# Patient Record
Sex: Female | Born: 1980 | Race: White | Hispanic: No | Marital: Married | Smoking: Former smoker
Health system: Southern US, Community
[De-identification: ages and names within clinical notes are randomized; demographics above are authoritative.]

## PROBLEM LIST (undated history)

## (undated) ENCOUNTER — Inpatient Hospital Stay (HOSPITAL_COMMUNITY): Payer: Self-pay

## (undated) DIAGNOSIS — D649 Anemia, unspecified: Secondary | ICD-10-CM

## (undated) DIAGNOSIS — E282 Polycystic ovarian syndrome: Secondary | ICD-10-CM

## (undated) DIAGNOSIS — I10 Essential (primary) hypertension: Secondary | ICD-10-CM

## (undated) HISTORY — PX: OTHER SURGICAL HISTORY: SHX169

## (undated) HISTORY — PX: TONSILLECTOMY: SUR1361

## (undated) HISTORY — PX: OVARIAN CYST REMOVAL: SHX89

## (undated) HISTORY — DX: Anemia, unspecified: D64.9

---

## 2001-02-20 ENCOUNTER — Emergency Department (HOSPITAL_COMMUNITY): Admission: EM | Admit: 2001-02-20 | Discharge: 2001-02-20 | Payer: Self-pay | Admitting: Emergency Medicine

## 2001-03-03 ENCOUNTER — Emergency Department (HOSPITAL_COMMUNITY): Admission: EM | Admit: 2001-03-03 | Discharge: 2001-03-03 | Payer: Self-pay | Admitting: Emergency Medicine

## 2003-04-12 ENCOUNTER — Emergency Department (HOSPITAL_COMMUNITY): Admission: EM | Admit: 2003-04-12 | Discharge: 2003-04-13 | Payer: Self-pay | Admitting: *Deleted

## 2003-04-13 ENCOUNTER — Encounter: Payer: Self-pay | Admitting: *Deleted

## 2003-05-28 ENCOUNTER — Emergency Department (HOSPITAL_COMMUNITY): Admission: EM | Admit: 2003-05-28 | Discharge: 2003-05-28 | Payer: Self-pay | Admitting: Emergency Medicine

## 2003-05-30 ENCOUNTER — Ambulatory Visit (HOSPITAL_COMMUNITY): Admission: RE | Admit: 2003-05-30 | Discharge: 2003-05-30 | Payer: Self-pay | Admitting: Emergency Medicine

## 2003-05-30 ENCOUNTER — Encounter: Payer: Self-pay | Admitting: Emergency Medicine

## 2003-06-26 ENCOUNTER — Emergency Department (HOSPITAL_COMMUNITY): Admission: EM | Admit: 2003-06-26 | Discharge: 2003-06-26 | Payer: Self-pay | Admitting: Emergency Medicine

## 2003-06-29 ENCOUNTER — Emergency Department (HOSPITAL_COMMUNITY): Admission: EM | Admit: 2003-06-29 | Discharge: 2003-06-30 | Payer: Self-pay | Admitting: Emergency Medicine

## 2003-06-30 ENCOUNTER — Encounter: Payer: Self-pay | Admitting: Emergency Medicine

## 2003-07-05 ENCOUNTER — Other Ambulatory Visit: Admission: RE | Admit: 2003-07-05 | Discharge: 2003-07-05 | Payer: Self-pay | Admitting: Obstetrics and Gynecology

## 2003-07-10 ENCOUNTER — Emergency Department (HOSPITAL_COMMUNITY): Admission: EM | Admit: 2003-07-10 | Discharge: 2003-07-10 | Payer: Self-pay | Admitting: Emergency Medicine

## 2003-07-20 ENCOUNTER — Ambulatory Visit (HOSPITAL_COMMUNITY): Admission: RE | Admit: 2003-07-20 | Discharge: 2003-07-20 | Payer: Self-pay | Admitting: Obstetrics and Gynecology

## 2003-11-19 ENCOUNTER — Emergency Department (HOSPITAL_COMMUNITY): Admission: EM | Admit: 2003-11-19 | Discharge: 2003-11-19 | Payer: Self-pay

## 2004-11-05 ENCOUNTER — Emergency Department (HOSPITAL_COMMUNITY): Admission: EM | Admit: 2004-11-05 | Discharge: 2004-11-05 | Payer: Self-pay | Admitting: Emergency Medicine

## 2005-10-12 ENCOUNTER — Emergency Department (HOSPITAL_COMMUNITY): Admission: EM | Admit: 2005-10-12 | Discharge: 2005-10-12 | Payer: Self-pay | Admitting: Emergency Medicine

## 2005-11-26 ENCOUNTER — Ambulatory Visit (HOSPITAL_COMMUNITY): Admission: RE | Admit: 2005-11-26 | Discharge: 2005-11-26 | Payer: Self-pay | Admitting: Family Medicine

## 2007-06-22 ENCOUNTER — Other Ambulatory Visit: Admission: RE | Admit: 2007-06-22 | Discharge: 2007-06-22 | Payer: Self-pay | Admitting: Obstetrics & Gynecology

## 2008-06-24 ENCOUNTER — Other Ambulatory Visit: Admission: RE | Admit: 2008-06-24 | Discharge: 2008-06-24 | Payer: Self-pay | Admitting: Obstetrics & Gynecology

## 2009-07-04 ENCOUNTER — Other Ambulatory Visit: Admission: RE | Admit: 2009-07-04 | Discharge: 2009-07-04 | Payer: Self-pay | Admitting: Obstetrics & Gynecology

## 2010-09-29 ENCOUNTER — Encounter: Payer: Self-pay | Admitting: Gynecology

## 2014-04-27 DIAGNOSIS — H33312 Horseshoe tear of retina without detachment, left eye: Secondary | ICD-10-CM | POA: Insufficient documentation

## 2014-04-27 DIAGNOSIS — H43392 Other vitreous opacities, left eye: Secondary | ICD-10-CM | POA: Insufficient documentation

## 2014-08-23 ENCOUNTER — Encounter: Payer: Self-pay | Admitting: *Deleted

## 2014-08-23 DIAGNOSIS — E559 Vitamin D deficiency, unspecified: Secondary | ICD-10-CM

## 2014-08-23 DIAGNOSIS — I10 Essential (primary) hypertension: Secondary | ICD-10-CM

## 2014-08-25 ENCOUNTER — Encounter: Payer: Self-pay | Admitting: Obstetrics & Gynecology

## 2014-08-25 ENCOUNTER — Ambulatory Visit (INDEPENDENT_AMBULATORY_CARE_PROVIDER_SITE_OTHER): Payer: 59 | Admitting: Obstetrics & Gynecology

## 2014-08-25 VITALS — BP 100/70 | Ht 62.0 in | Wt 269.0 lb

## 2014-08-25 DIAGNOSIS — D500 Iron deficiency anemia secondary to blood loss (chronic): Secondary | ICD-10-CM

## 2014-08-25 DIAGNOSIS — N921 Excessive and frequent menstruation with irregular cycle: Secondary | ICD-10-CM

## 2014-08-25 MED ORDER — MEGESTROL ACETATE 40 MG PO TABS: ORAL_TABLET | ORAL | Status: DC

## 2014-08-25 NOTE — Progress Notes (Signed)
Patient ID: Janet Deleon, female   DOB: 1980-12-27, 33 y.o.   MRN: [id number] Pt well known to me from years gone by  Chronic issue of heavy heavy vaginal bleeding and sometimes prolonged 4 months most recently  Have used megestrol in past with success  Will need follow up scan and pelvic  Blood pressure 100/70, weight 269 lb (122.018 kg).   Follow up 6 weeks

## 2014-10-05 ENCOUNTER — Other Ambulatory Visit: Payer: Self-pay | Admitting: Obstetrics & Gynecology

## 2014-10-05 DIAGNOSIS — N920 Excessive and frequent menstruation with regular cycle: Secondary | ICD-10-CM

## 2014-10-06 ENCOUNTER — Ambulatory Visit (INDEPENDENT_AMBULATORY_CARE_PROVIDER_SITE_OTHER): Payer: 59 | Admitting: Obstetrics & Gynecology

## 2014-10-06 ENCOUNTER — Ambulatory Visit (INDEPENDENT_AMBULATORY_CARE_PROVIDER_SITE_OTHER): Payer: 59

## 2014-10-06 ENCOUNTER — Encounter: Payer: Self-pay | Admitting: Obstetrics & Gynecology

## 2014-10-06 VITALS — BP 120/80 | Wt 274.0 lb

## 2014-10-06 DIAGNOSIS — N920 Excessive and frequent menstruation with regular cycle: Secondary | ICD-10-CM

## 2014-10-06 DIAGNOSIS — D500 Iron deficiency anemia secondary to blood loss (chronic): Secondary | ICD-10-CM

## 2014-10-06 DIAGNOSIS — N921 Excessive and frequent menstruation with irregular cycle: Secondary | ICD-10-CM

## 2014-10-06 NOTE — Progress Notes (Signed)
Patient ID: Janet Deleon, female   DOB: Nov 12, 1980, 34 y.o.   MRN: [id number] US Transvaginal Non-ob  10/06/2014   GYNECOLOGIC SONOGRAM   Janet Deleon is a 34 y.o.  for a pelvic sonogram for mlenorrhagia. PT is  currently taking Megace.  Uterus                      9.4 x 6.8 x 5.7 cm, anteverted with ?fibroid  noted within the fundus-2.5cm  Endometrium          25 mm, symmetrical, no obvious solitary area of  thickening or mass noted   Right ovary             2.5 x 1.7 x 1.4 cm,   Left ovary                3.3 x 2.2 x 1.9 cm,   No free fluid or adnexal masses noted within the pelvis  Technician Comments:  Anteverted uterus, with ?fibroid noted within the fundus, Diffusely  thickened endometrium although no obvious solitary area of thickening or  mass noted within, bilateral adnexa/ovaries appear WNL, no free fluid  noted   Janet Deleon 10/06/2014 3:10 PM  Clinical Impression and recommendations:  I have reviewed the sonogram results above, combined with the patient's  current clinical course, below are my impressions and any appropriate  recommendations for management based on the sonographic findings.  Thickened endometrium secondary to megace Otherwise normal gyn anatomy   EURE,LUTHER H 10/06/2014 4:01 PM      Doing well on megestrol amenorrheic Will continue  Follow up for Pap 6 months yearly

## 2016-01-04 DIAGNOSIS — D500 Iron deficiency anemia secondary to blood loss (chronic): Secondary | ICD-10-CM | POA: Diagnosis not present

## 2016-01-04 DIAGNOSIS — I10 Essential (primary) hypertension: Secondary | ICD-10-CM | POA: Diagnosis not present

## 2016-01-04 DIAGNOSIS — N924 Excessive bleeding in the premenopausal period: Secondary | ICD-10-CM | POA: Diagnosis not present

## 2016-01-04 DIAGNOSIS — R5383 Other fatigue: Secondary | ICD-10-CM | POA: Diagnosis not present

## 2016-03-14 DIAGNOSIS — Z01419 Encounter for gynecological examination (general) (routine) without abnormal findings: Secondary | ICD-10-CM | POA: Diagnosis not present

## 2016-03-14 DIAGNOSIS — E282 Polycystic ovarian syndrome: Secondary | ICD-10-CM | POA: Diagnosis not present

## 2016-03-14 DIAGNOSIS — Z6841 Body Mass Index (BMI) 40.0 and over, adult: Secondary | ICD-10-CM | POA: Diagnosis not present

## 2016-03-14 DIAGNOSIS — Z01411 Encounter for gynecological examination (general) (routine) with abnormal findings: Secondary | ICD-10-CM | POA: Diagnosis not present

## 2016-03-14 DIAGNOSIS — R875 Abnormal microbiological findings in specimens from female genital organs: Secondary | ICD-10-CM | POA: Diagnosis not present

## 2016-03-14 DIAGNOSIS — Z131 Encounter for screening for diabetes mellitus: Secondary | ICD-10-CM | POA: Diagnosis not present

## 2016-03-14 DIAGNOSIS — Z1151 Encounter for screening for human papillomavirus (HPV): Secondary | ICD-10-CM | POA: Diagnosis not present

## 2016-04-08 DIAGNOSIS — E669 Obesity, unspecified: Secondary | ICD-10-CM | POA: Diagnosis not present

## 2016-04-08 DIAGNOSIS — E282 Polycystic ovarian syndrome: Secondary | ICD-10-CM | POA: Diagnosis not present

## 2016-05-01 DIAGNOSIS — D500 Iron deficiency anemia secondary to blood loss (chronic): Secondary | ICD-10-CM | POA: Diagnosis not present

## 2016-06-06 DIAGNOSIS — Z713 Dietary counseling and surveillance: Secondary | ICD-10-CM | POA: Diagnosis not present

## 2016-06-06 DIAGNOSIS — E785 Hyperlipidemia, unspecified: Secondary | ICD-10-CM | POA: Diagnosis not present

## 2016-06-06 DIAGNOSIS — I10 Essential (primary) hypertension: Secondary | ICD-10-CM | POA: Diagnosis not present

## 2016-06-06 DIAGNOSIS — D500 Iron deficiency anemia secondary to blood loss (chronic): Secondary | ICD-10-CM | POA: Diagnosis not present

## 2016-06-06 DIAGNOSIS — Z0000 Encounter for general adult medical examination without abnormal findings: Secondary | ICD-10-CM | POA: Diagnosis not present

## 2016-06-06 DIAGNOSIS — Z0389 Encounter for observation for other suspected diseases and conditions ruled out: Secondary | ICD-10-CM | POA: Diagnosis not present

## 2016-06-06 DIAGNOSIS — E559 Vitamin D deficiency, unspecified: Secondary | ICD-10-CM | POA: Diagnosis not present

## 2016-06-10 DIAGNOSIS — E669 Obesity, unspecified: Secondary | ICD-10-CM | POA: Diagnosis not present

## 2016-06-10 DIAGNOSIS — Z6841 Body Mass Index (BMI) 40.0 and over, adult: Secondary | ICD-10-CM | POA: Diagnosis not present

## 2016-06-10 DIAGNOSIS — Z3169 Encounter for other general counseling and advice on procreation: Secondary | ICD-10-CM | POA: Diagnosis not present

## 2016-07-25 DIAGNOSIS — E282 Polycystic ovarian syndrome: Secondary | ICD-10-CM | POA: Diagnosis not present

## 2016-07-25 DIAGNOSIS — R945 Abnormal results of liver function studies: Secondary | ICD-10-CM | POA: Diagnosis not present

## 2016-07-25 DIAGNOSIS — I10 Essential (primary) hypertension: Secondary | ICD-10-CM | POA: Diagnosis not present

## 2016-09-03 DIAGNOSIS — N970 Female infertility associated with anovulation: Secondary | ICD-10-CM | POA: Diagnosis not present

## 2016-09-09 NOTE — L&D Delivery Note (Signed)
Delivery Note At 3:06 PM a viable female was delivered via  (Presentation:DOA ;  ).  APGAR: pending- good cry./ tone/ color, ; weight  . pending  Placenta status: spontaneous, intact .  Cord:  3VC with the following complications: none.  Cord pH: n/a  Anesthesia:  none Episiotomy:  none Lacerations:  Small superficial 1st degree mucosal interruptions, hemostatis Suture Repair: n/a Est. Blood Loss (mL):  250  Mom to postpartum.  Baby to Couplet care / Skin to Skin.  Janet Deleon A. 07/04/2017, 3:16 PM

## 2016-10-22 DIAGNOSIS — N970 Female infertility associated with anovulation: Secondary | ICD-10-CM | POA: Diagnosis not present

## 2016-11-05 DIAGNOSIS — Z131 Encounter for screening for diabetes mellitus: Secondary | ICD-10-CM | POA: Diagnosis not present

## 2016-11-05 DIAGNOSIS — D500 Iron deficiency anemia secondary to blood loss (chronic): Secondary | ICD-10-CM | POA: Diagnosis not present

## 2016-11-05 DIAGNOSIS — R945 Abnormal results of liver function studies: Secondary | ICD-10-CM | POA: Diagnosis not present

## 2016-11-05 DIAGNOSIS — E559 Vitamin D deficiency, unspecified: Secondary | ICD-10-CM | POA: Diagnosis not present

## 2016-11-05 DIAGNOSIS — I10 Essential (primary) hypertension: Secondary | ICD-10-CM | POA: Diagnosis not present

## 2016-11-05 DIAGNOSIS — N912 Amenorrhea, unspecified: Secondary | ICD-10-CM | POA: Diagnosis not present

## 2016-11-06 DIAGNOSIS — O0900 Supervision of pregnancy with history of infertility, unspecified trimester: Secondary | ICD-10-CM | POA: Diagnosis not present

## 2016-11-06 DIAGNOSIS — Z3201 Encounter for pregnancy test, result positive: Secondary | ICD-10-CM | POA: Diagnosis not present

## 2016-11-06 DIAGNOSIS — Z3A01 Less than 8 weeks gestation of pregnancy: Secondary | ICD-10-CM | POA: Diagnosis not present

## 2016-11-08 DIAGNOSIS — O0900 Supervision of pregnancy with history of infertility, unspecified trimester: Secondary | ICD-10-CM | POA: Diagnosis not present

## 2016-11-08 DIAGNOSIS — Z3A01 Less than 8 weeks gestation of pregnancy: Secondary | ICD-10-CM | POA: Diagnosis not present

## 2016-11-12 DIAGNOSIS — O0900 Supervision of pregnancy with history of infertility, unspecified trimester: Secondary | ICD-10-CM | POA: Diagnosis not present

## 2016-11-12 DIAGNOSIS — Z3A01 Less than 8 weeks gestation of pregnancy: Secondary | ICD-10-CM | POA: Diagnosis not present

## 2016-11-29 DIAGNOSIS — Z3201 Encounter for pregnancy test, result positive: Secondary | ICD-10-CM | POA: Diagnosis not present

## 2016-12-08 ENCOUNTER — Inpatient Hospital Stay (HOSPITAL_COMMUNITY)
Admission: AD | Admit: 2016-12-08 | Discharge: 2016-12-08 | Disposition: A | Discharge status: Home / Self Care | Payer: BLUE CROSS/BLUE SHIELD | Source: Ambulatory Visit | Attending: Obstetrics and Gynecology | Admitting: Obstetrics and Gynecology

## 2016-12-08 ENCOUNTER — Inpatient Hospital Stay (HOSPITAL_COMMUNITY): Payer: BLUE CROSS/BLUE SHIELD

## 2016-12-08 ENCOUNTER — Encounter (HOSPITAL_COMMUNITY): Payer: Self-pay | Admitting: *Deleted

## 2016-12-08 DIAGNOSIS — O99011 Anemia complicating pregnancy, first trimester: Secondary | ICD-10-CM | POA: Insufficient documentation

## 2016-12-08 DIAGNOSIS — Z7984 Long term (current) use of oral hypoglycemic drugs: Secondary | ICD-10-CM | POA: Diagnosis not present

## 2016-12-08 DIAGNOSIS — Z3A08 8 weeks gestation of pregnancy: Secondary | ICD-10-CM | POA: Insufficient documentation

## 2016-12-08 DIAGNOSIS — O209 Hemorrhage in early pregnancy, unspecified: Secondary | ICD-10-CM | POA: Diagnosis not present

## 2016-12-08 DIAGNOSIS — Z882 Allergy status to sulfonamides status: Secondary | ICD-10-CM | POA: Diagnosis not present

## 2016-12-08 DIAGNOSIS — Z885 Allergy status to narcotic agent status: Secondary | ICD-10-CM | POA: Insufficient documentation

## 2016-12-08 LAB — CBC WITH DIFFERENTIAL/PLATELET
BASOS ABS: 0.1 10*3/uL (ref 0.0–0.1)
Basophils Relative: 0 %
EOS ABS: 0.1 10*3/uL (ref 0.0–0.7)
EOS PCT: 1 %
HCT: 40.6 % (ref 36.0–46.0)
HEMOGLOBIN: 13.3 g/dL (ref 12.0–15.0)
Lymphocytes Relative: 17 %
Lymphs Abs: 2.4 10*3/uL (ref 0.7–4.0)
MCH: 28.4 pg (ref 26.0–34.0)
MCHC: 32.8 g/dL (ref 30.0–36.0)
MCV: 86.8 fL (ref 78.0–100.0)
Monocytes Absolute: 0.5 10*3/uL (ref 0.1–1.0)
Monocytes Relative: 4 %
NEUTROS PCT: 78 %
Neutro Abs: 11.2 10*3/uL — ABNORMAL HIGH (ref 1.7–7.7)
Platelets: 305 10*3/uL (ref 150–400)
RBC: 4.68 MIL/uL (ref 3.87–5.11)
RDW: 16.2 % — AB (ref 11.5–15.5)
WBC: 14.3 10*3/uL — ABNORMAL HIGH (ref 4.0–10.5)

## 2016-12-08 LAB — ABO/RH: ABO/RH(D): O POS

## 2016-12-08 NOTE — Discharge Instructions (Signed)
Vaginal Bleeding During Pregnancy, First Trimester A small amount of bleeding (spotting) from the vagina is common in early pregnancy. Sometimes the bleeding is normal and is not a problem, and sometimes it is a sign of something serious. Be sure to tell your doctor about any bleeding from your vagina right away. Follow these instructions at home:  Watch your condition for any changes.  Follow your doctor's instructions about how active you can be.  If you are on bed rest:  You may need to stay in bed and only get up to use the bathroom.  You may be allowed to do some activities.  If you need help, make plans for someone to help you.  Write down:  The number of pads you use each day.  How often you change pads.  How soaked (saturated) your pads are.  Do not use tampons.  Do not douche.  Do not have sex or orgasms until your doctor says it is okay.  If you pass any tissue from your vagina, save the tissue so you can show it to your doctor.  Only take medicines as told by your doctor.  Do not take aspirin because it can make you bleed.  Keep all follow-up visits as told by your doctor. Contact a doctor if:  You bleed from your vagina.  You have cramps.  You have labor pains.  You have a fever that does not go away after you take medicine. Get help right away if:  You have very bad cramps in your back or belly (abdomen).  You pass large clots or tissue from your vagina.  You bleed more.  You feel light-headed or weak.  You pass out (faint).  You have chills.  You are leaking fluid or have a gush of fluid from your vagina.  You pass out while pooping (having a bowel movement). This information is not intended to replace advice given to you by your health care provider. Make sure you discuss any questions you have with your health care provider. Document Released: 01/10/2014 Document Revised: 02/01/2016 Document Reviewed: 05/03/2013 Elsevier Interactive  Patient Education  2017 [employer].   Pelvic Rest Pelvic rest may be recommended if:  Your placenta is partially or completely covering the opening of your cervix (placenta previa).  There is bleeding between the wall of the uterus and the amniotic sac in the first trimester of pregnancy (subchorionic hemorrhage).  You went into labor too early (preterm labor). Based on your overall health and the health of your baby, your health care provider will decide if pelvic rest is right for you. How do I rest my pelvis? For as long as told by your health care provider:  Do not have sex, sexual stimulation, or an orgasm.  Do not use tampons. Do not douche. Do not put anything in your vagina.  Do not lift anything that is heavier than 10 lb (4.5 kg).  Avoid activities that take a lot of effort (are strenuous).  Avoid any activity in which your pelvic muscles could become strained. When should I seek medical care? Seek medical care if you have:  Cramping pain in your lower abdomen.  Vaginal discharge.  A low, dull backache.  Regular contractions.  Uterine tightening. When should I seek immediate medical care? Seek immediate medical care if:  You have vaginal bleeding and you are pregnant. This information is not intended to replace advice given to you by your health care provider. Make sure you discuss any  questions you have with your health care provider. Document Released: 12/21/2010 Document Revised: 02/01/2016 Document Reviewed: 02/27/2015 Elsevier Interactive Patient Education  2017 [employer].

## 2016-12-08 NOTE — MAU Note (Signed)
Bleeding kind of bad.  Started spotting on Fri.  Had intercourse on Thursday night, first time since found out she was preg. Having little twinges, very anxious. Has a viable [place] noted.

## 2016-12-08 NOTE — Progress Notes (Signed)
Reviewed D/C instructions with pt and FOB.  Keep scheduled appt with Dr Gardiner Coins office.

## 2016-12-08 NOTE — MAU Provider Note (Signed)
History     CSN: [id number]  Arrival date and time: 12/08/16 1338   First Provider Initiated Contact with Patient 12/08/16 1408      Chief Complaint  Patient presents with  . vag bleeding in preg   HPI  Ms. Janet Deleon is a 37 y.o. G1P0 at [redacted]w[redacted]d who presents to MAU today with complaint of vaginal bleeding. The patient states that she had sex Thursday night and then started spotting on Friday. She states today bleeding became much heavier with small clots. She denies pain, fever or UTI symptoms. She has had some mild nausea without vomiting.   OB History    Gravida Para Term Preterm AB Living   1             SAB TAB Ectopic Multiple Live Births                  Past Medical History:  Diagnosis Date  . Anemia     Past Surgical History:  Procedure Laterality Date  . lt eye surgery      No family history on file.  Social History  Substance Use Topics  . Smoking status: Never Smoker  . Smokeless tobacco: Not on file  . Alcohol use Not on file    Allergies:  Allergies  Allergen Reactions  . Demerol [Meperidine] Other (See Comments)    High fever, vomiting  . Sulfa Antibiotics Hives    Prescriptions Prior to Admission  Medication Sig Dispense Refill Last Dose  . labetalol (NORMODYNE) 100 MG tablet Take 100 mg by mouth 2 (two) times daily.   12/08/2016 at 0800  . metFORMIN (GLUCOPHAGE) 500 MG tablet Take 500 mg by mouth 2 (two) times daily with a meal.   12/08/2016 at Unknown time  . Prenatal Vit-Fe Fumarate-FA (PRENATAL MULTIVITAMIN) TABS tablet Take 1 tablet by mouth daily at 12 noon.   12/07/2016 at Unknown time  . megestrol (MEGACE) 40 MG tablet 3 tablets all at once for 5 days then 2 tablets for 5 days the 1 tablet daily 45 tablet 11 Taking    Review of Systems  Constitutional: Negative for fever.  Gastrointestinal: Negative for abdominal pain, constipation, diarrhea, nausea and vomiting.  Genitourinary: Positive for vaginal bleeding. Negative for dysuria,  frequency, urgency and vaginal discharge.   Physical Exam   Blood pressure (!) 125/98, pulse 91, temperature 98.3 F (36.8 C), temperature source Oral, resp. rate 18, weight 267 lb 12 oz (121.5 kg), last menstrual period 10/10/2016, SpO2 99 %.  Physical Exam  Nursing note and vitals reviewed. Constitutional: She is oriented to person, place, and time. She appears well-developed and well-nourished. No distress.  HENT:  Head: Normocephalic and atraumatic.  Cardiovascular: Normal rate.   Respiratory: Effort normal.  GI: Soft. She exhibits no distension and no mass. There is no tenderness. There is no rebound and no guarding.  Genitourinary: Uterus is not enlarged (exam limited by maternal body habitus) and not tender. Cervix exhibits no motion tenderness, no discharge and no friability. There is bleeding (small) in the vagina. No vaginal discharge found.  Neurological: She is alert and oriented to person, place, and time.  Skin: Skin is warm and dry. No erythema.  Psychiatric: She has a normal mood and affect.   Results for orders placed or performed during the hospital encounter of 12/08/16 (from the past 24 hour(s))  CBC with Differential/Platelet     Status: Abnormal   Collection Time: 12/08/16  3:07 PM  Result Value  Ref Range   WBC 14.3 (H) 4.0 - 10.5 K/uL   RBC 4.68 3.87 - 5.11 MIL/uL   Hemoglobin 13.3 12.0 - 15.0 g/dL   HCT 40.6 36.0 - 46.0 %   MCV 86.8 78.0 - 100.0 fL   MCH 28.4 26.0 - 34.0 pg   MCHC 32.8 30.0 - 36.0 g/dL   RDW 16.2 (H) 11.5 - 15.5 %   Platelets 305 150 - 400 K/uL   Neutrophils Relative % 78 %   Neutro Abs 11.2 (H) 1.7 - 7.7 K/uL   Lymphocytes Relative 17 %   Lymphs Abs 2.4 0.7 - 4.0 K/uL   Monocytes Relative 4 %   Monocytes Absolute 0.5 0.1 - 1.0 K/uL   Eosinophils Relative 1 %   Eosinophils Absolute 0.1 0.0 - 0.7 K/uL   Basophils Relative 0 %   Basophils Absolute 0.1 0.0 - 0.1 K/uL  ABO/Rh     Status: None (Preliminary result)   Collection Time:  12/08/16  3:07 PM  Result Value Ref Range   ABO/RH(D) O POS    US Ob Comp Less 14 Wks  Result Date: 12/08/2016 CLINICAL DATA:  Vaginal bleeding. Eight weeks and 3 days pregnant by last menstrual period. EXAM: OBSTETRIC <14 WK [place] AND TRANSVAGINAL OB US TECHNIQUE: Both transabdominal and transvaginal ultrasound examinations were performed for complete evaluation of the gestation as well as the maternal uterus, adnexal regions, and pelvic cul-de-sac. Transvaginal technique was performed to assess early pregnancy. COMPARISON:  None. FINDINGS: Intrauterine gestational sac: Visualized Yolk sac:  Visualized Embryo:  Visualized Cardiac Activity: Visualized Heart Rate: 188  bpm CRL:  20.4  mm   8 w   4 d                  [place] EDC: 07/16/2017 Subchorionic hemorrhage:  None visualized. Maternal uterus/adnexae: Normal appearing ovaries. No free peritoneal fluid. IMPRESSION: Single live intrauterine gestation with an estimated gestational age of [redacted] weeks and 4 days. No complicating features. Electronically Signed   By: Claudie Revering M.D.   On: 12/08/2016 15:10   US Ob Transvaginal  Result Date: 12/08/2016 CLINICAL DATA:  Vaginal bleeding. Eight weeks and 3 days pregnant by last menstrual period. EXAM: OBSTETRIC <14 WK [place] AND TRANSVAGINAL OB US TECHNIQUE: Both transabdominal and transvaginal ultrasound examinations were performed for complete evaluation of the gestation as well as the maternal uterus, adnexal regions, and pelvic cul-de-sac. Transvaginal technique was performed to assess early pregnancy. COMPARISON:  None. FINDINGS: Intrauterine gestational sac: Visualized Yolk sac:  Visualized Embryo:  Visualized Cardiac Activity: Visualized Heart Rate: 188  bpm CRL:  20.4  mm   8 w   4 d                  [place] EDC: 07/16/2017 Subchorionic hemorrhage:  None visualized. Maternal uterus/adnexae: Normal appearing ovaries. No free peritoneal fluid. IMPRESSION: Single live intrauterine gestation with an estimated gestational age of [redacted]  weeks and 4 days. No complicating features. Electronically Signed   By: Claudie Revering M.D.   On: 12/08/2016 15:10    MAU Course  Procedures None  MDM CBC, ABO/RH and [place] today  Discussed patient with Dr. Ronita Hipps. Agrees with plan for discharge at this time Assessment and Plan  A:  SIUP at [redacted]w[redacted]d Vaginal bleeding in pregnancy, first trimester   P:  Discharge home Bleeding/first trimester precautions and pelvic rest discussed Patient advised to follow-up with [facility] as scheduled for routine prenatal care Patient may return  to MAU as needed or if her condition were to change or worsen  Luvenia Redden, PA-C  12/08/2016, 3:58 PM

## 2016-12-19 DIAGNOSIS — O10911 Unspecified pre-existing hypertension complicating pregnancy, first trimester: Secondary | ICD-10-CM | POA: Diagnosis not present

## 2016-12-19 DIAGNOSIS — O10919 Unspecified pre-existing hypertension complicating pregnancy, unspecified trimester: Secondary | ICD-10-CM | POA: Diagnosis not present

## 2016-12-19 DIAGNOSIS — Z118 Encounter for screening for other infectious and parasitic diseases: Secondary | ICD-10-CM | POA: Diagnosis not present

## 2016-12-19 DIAGNOSIS — O09521 Supervision of elderly multigravida, first trimester: Secondary | ICD-10-CM | POA: Diagnosis not present

## 2016-12-19 DIAGNOSIS — O0901 Supervision of pregnancy with history of infertility, first trimester: Secondary | ICD-10-CM | POA: Diagnosis not present

## 2016-12-19 DIAGNOSIS — Z3A10 10 weeks gestation of pregnancy: Secondary | ICD-10-CM | POA: Diagnosis not present

## 2016-12-19 DIAGNOSIS — Z3689 Encounter for other specified antenatal screening: Secondary | ICD-10-CM | POA: Diagnosis not present

## 2016-12-19 DIAGNOSIS — Z3401 Encounter for supervision of normal first pregnancy, first trimester: Secondary | ICD-10-CM | POA: Diagnosis not present

## 2016-12-19 LAB — OB RESULTS CONSOLE HEPATITIS B SURFACE ANTIGEN: Hepatitis B Surface Ag: NEGATIVE

## 2016-12-19 LAB — OB RESULTS CONSOLE ABO/RH: RH Type: POSITIVE

## 2016-12-19 LAB — OB RESULTS CONSOLE RUBELLA ANTIBODY, IGM: RUBELLA: IMMUNE

## 2016-12-19 LAB — OB RESULTS CONSOLE GC/CHLAMYDIA
CHLAMYDIA, DNA PROBE: NEGATIVE
GC PROBE AMP, GENITAL: NEGATIVE

## 2016-12-19 LAB — OB RESULTS CONSOLE RPR: RPR: NONREACTIVE

## 2016-12-19 LAB — OB RESULTS CONSOLE GBS: STREP GROUP B AG: POSITIVE

## 2016-12-19 LAB — OB RESULTS CONSOLE ANTIBODY SCREEN: ANTIBODY SCREEN: NEGATIVE

## 2016-12-19 LAB — OB RESULTS CONSOLE HIV ANTIBODY (ROUTINE TESTING): HIV: NONREACTIVE

## 2016-12-23 DIAGNOSIS — O10919 Unspecified pre-existing hypertension complicating pregnancy, unspecified trimester: Secondary | ICD-10-CM | POA: Diagnosis not present

## 2016-12-23 DIAGNOSIS — Z3A10 10 weeks gestation of pregnancy: Secondary | ICD-10-CM | POA: Diagnosis not present

## 2017-01-01 DIAGNOSIS — Z3689 Encounter for other specified antenatal screening: Secondary | ICD-10-CM | POA: Diagnosis not present

## 2017-01-01 DIAGNOSIS — O2340 Unspecified infection of urinary tract in pregnancy, unspecified trimester: Secondary | ICD-10-CM | POA: Diagnosis not present

## 2017-01-01 DIAGNOSIS — O200 Threatened abortion: Secondary | ICD-10-CM | POA: Diagnosis not present

## 2017-01-01 DIAGNOSIS — Z3A11 11 weeks gestation of pregnancy: Secondary | ICD-10-CM | POA: Diagnosis not present

## 2017-01-09 DIAGNOSIS — O09521 Supervision of elderly multigravida, first trimester: Secondary | ICD-10-CM | POA: Diagnosis not present

## 2017-01-09 DIAGNOSIS — O09529 Supervision of elderly multigravida, unspecified trimester: Secondary | ICD-10-CM | POA: Diagnosis not present

## 2017-01-09 DIAGNOSIS — O10919 Unspecified pre-existing hypertension complicating pregnancy, unspecified trimester: Secondary | ICD-10-CM | POA: Diagnosis not present

## 2017-01-09 DIAGNOSIS — Z3A13 13 weeks gestation of pregnancy: Secondary | ICD-10-CM | POA: Diagnosis not present

## 2017-01-20 DIAGNOSIS — O10919 Unspecified pre-existing hypertension complicating pregnancy, unspecified trimester: Secondary | ICD-10-CM | POA: Diagnosis not present

## 2017-01-20 DIAGNOSIS — O09522 Supervision of elderly multigravida, second trimester: Secondary | ICD-10-CM | POA: Diagnosis not present

## 2017-01-20 DIAGNOSIS — Z3A14 14 weeks gestation of pregnancy: Secondary | ICD-10-CM | POA: Diagnosis not present

## 2017-01-31 DIAGNOSIS — O99212 Obesity complicating pregnancy, second trimester: Secondary | ICD-10-CM | POA: Diagnosis not present

## 2017-01-31 DIAGNOSIS — Z3A16 16 weeks gestation of pregnancy: Secondary | ICD-10-CM | POA: Diagnosis not present

## 2017-01-31 DIAGNOSIS — Z361 Encounter for antenatal screening for raised alphafetoprotein level: Secondary | ICD-10-CM | POA: Diagnosis not present

## 2017-01-31 DIAGNOSIS — Z3689 Encounter for other specified antenatal screening: Secondary | ICD-10-CM | POA: Diagnosis not present

## 2017-01-31 DIAGNOSIS — O10912 Unspecified pre-existing hypertension complicating pregnancy, second trimester: Secondary | ICD-10-CM | POA: Diagnosis not present

## 2017-01-31 DIAGNOSIS — O09522 Supervision of elderly multigravida, second trimester: Secondary | ICD-10-CM | POA: Diagnosis not present

## 2017-02-04 DIAGNOSIS — I10 Essential (primary) hypertension: Secondary | ICD-10-CM | POA: Diagnosis not present

## 2017-02-18 DIAGNOSIS — O09522 Supervision of elderly multigravida, second trimester: Secondary | ICD-10-CM | POA: Diagnosis not present

## 2017-02-18 DIAGNOSIS — O10912 Unspecified pre-existing hypertension complicating pregnancy, second trimester: Secondary | ICD-10-CM | POA: Diagnosis not present

## 2017-02-18 DIAGNOSIS — Z3A18 18 weeks gestation of pregnancy: Secondary | ICD-10-CM | POA: Diagnosis not present

## 2017-03-10 ENCOUNTER — Encounter (HOSPITAL_COMMUNITY): Payer: Self-pay | Admitting: *Deleted

## 2017-03-10 ENCOUNTER — Observation Stay (HOSPITAL_COMMUNITY): Payer: BLUE CROSS/BLUE SHIELD | Admitting: Certified Registered Nurse Anesthetist

## 2017-03-10 ENCOUNTER — Other Ambulatory Visit: Payer: Self-pay | Admitting: Obstetrics & Gynecology

## 2017-03-10 ENCOUNTER — Encounter (HOSPITAL_COMMUNITY)
Admission: AD | Disposition: A | Discharge status: Home / Self Care | Payer: Self-pay | Source: Ambulatory Visit | Attending: Obstetrics & Gynecology

## 2017-03-10 ENCOUNTER — Observation Stay (HOSPITAL_COMMUNITY)
Admission: AD | Admit: 2017-03-10 | Discharge: 2017-03-11 | Disposition: A | Discharge status: Home / Self Care | Payer: BLUE CROSS/BLUE SHIELD | Source: Ambulatory Visit | Attending: Obstetrics & Gynecology | Admitting: Obstetrics & Gynecology

## 2017-03-10 DIAGNOSIS — O162 Unspecified maternal hypertension, second trimester: Secondary | ICD-10-CM | POA: Insufficient documentation

## 2017-03-10 DIAGNOSIS — Z9889 Other specified postprocedural states: Secondary | ICD-10-CM | POA: Insufficient documentation

## 2017-03-10 DIAGNOSIS — Z87891 Personal history of nicotine dependence: Secondary | ICD-10-CM | POA: Diagnosis not present

## 2017-03-10 DIAGNOSIS — O99012 Anemia complicating pregnancy, second trimester: Secondary | ICD-10-CM | POA: Insufficient documentation

## 2017-03-10 DIAGNOSIS — O3480 Maternal care for other abnormalities of pelvic organs, unspecified trimester: Secondary | ICD-10-CM | POA: Insufficient documentation

## 2017-03-10 DIAGNOSIS — O09522 Supervision of elderly multigravida, second trimester: Secondary | ICD-10-CM | POA: Diagnosis not present

## 2017-03-10 DIAGNOSIS — O3432 Maternal care for cervical incompetence, second trimester: Principal | ICD-10-CM | POA: Diagnosis present

## 2017-03-10 DIAGNOSIS — Z7982 Long term (current) use of aspirin: Secondary | ICD-10-CM | POA: Diagnosis not present

## 2017-03-10 DIAGNOSIS — E282 Polycystic ovarian syndrome: Secondary | ICD-10-CM | POA: Insufficient documentation

## 2017-03-10 DIAGNOSIS — Q512 Other doubling of uterus: Secondary | ICD-10-CM | POA: Diagnosis not present

## 2017-03-10 DIAGNOSIS — O24912 Unspecified diabetes mellitus in pregnancy, second trimester: Secondary | ICD-10-CM | POA: Insufficient documentation

## 2017-03-10 DIAGNOSIS — Z3A21 21 weeks gestation of pregnancy: Secondary | ICD-10-CM | POA: Diagnosis not present

## 2017-03-10 DIAGNOSIS — Z7984 Long term (current) use of oral hypoglycemic drugs: Secondary | ICD-10-CM | POA: Insufficient documentation

## 2017-03-10 DIAGNOSIS — O3462 Maternal care for abnormality of vagina, second trimester: Secondary | ICD-10-CM | POA: Diagnosis not present

## 2017-03-10 HISTORY — PX: CERVICAL CERCLAGE: SHX1329

## 2017-03-10 LAB — TYPE AND SCREEN
ABO/RH(D): O POS
ANTIBODY SCREEN: NEGATIVE

## 2017-03-10 LAB — CBC
HCT: 36.6 % (ref 36.0–46.0)
Hemoglobin: 12.2 g/dL (ref 12.0–15.0)
MCH: 29.8 pg (ref 26.0–34.0)
MCHC: 33.3 g/dL (ref 30.0–36.0)
MCV: 89.3 fL (ref 78.0–100.0)
PLATELETS: 238 10*3/uL (ref 150–400)
RBC: 4.1 MIL/uL (ref 3.87–5.11)
RDW: 14.9 % (ref 11.5–15.5)
WBC: 11.4 10*3/uL — ABNORMAL HIGH (ref 4.0–10.5)

## 2017-03-10 SURGERY — CERCLAGE, CERVIX, VAGINAL APPROACH
Anesthesia: Spinal

## 2017-03-10 MED ORDER — LABETALOL HCL 100 MG PO TABS
100.0000 mg | ORAL_TABLET | Freq: Two times a day (BID) | ORAL | Status: DC
  Administered 2017-03-10: 100 mg via ORAL
  Filled 2017-03-10 (×2): qty 1

## 2017-03-10 MED ORDER — NALBUPHINE SYRINGE 5 MG/0.5 ML
5.0000 mg | INJECTION | INTRAMUSCULAR | Status: DC | PRN
  Filled 2017-03-10: qty 0.5

## 2017-03-10 MED ORDER — ZOLPIDEM TARTRATE 5 MG PO TABS: 5.0000 mg | ORAL_TABLET | Freq: Every evening | ORAL | Status: DC | PRN

## 2017-03-10 MED ORDER — FENTANYL CITRATE (PF) 100 MCG/2ML IJ SOLN
INTRAMUSCULAR | Status: AC
  Filled 2017-03-10: qty 2

## 2017-03-10 MED ORDER — ONDANSETRON HCL 4 MG/2ML IJ SOLN: 4.0000 mg | Freq: Three times a day (TID) | INTRAMUSCULAR | Status: DC | PRN

## 2017-03-10 MED ORDER — DOCUSATE SODIUM 100 MG PO CAPS
100.0000 mg | ORAL_CAPSULE | Freq: Every day | ORAL | Status: DC
  Administered 2017-03-10 – 2017-03-11 (×2): 100 mg via ORAL
  Filled 2017-03-10 (×2): qty 1

## 2017-03-10 MED ORDER — IBUPROFEN 600 MG PO TABS
600.0000 mg | ORAL_TABLET | Freq: Four times a day (QID) | ORAL | Status: DC
  Administered 2017-03-10 – 2017-03-11 (×4): 600 mg via ORAL
  Filled 2017-03-10 (×4): qty 1

## 2017-03-10 MED ORDER — PRENATAL MULTIVITAMIN CH: 1.0000 | ORAL_TABLET | Freq: Every day | ORAL | Status: DC

## 2017-03-10 MED ORDER — NALBUPHINE SYRINGE 5 MG/0.5 ML
5.0000 mg | INJECTION | Freq: Once | INTRAMUSCULAR | Status: DC | PRN
  Filled 2017-03-10: qty 0.5

## 2017-03-10 MED ORDER — ACETAMINOPHEN 325 MG PO TABS: 650.0000 mg | ORAL_TABLET | ORAL | Status: DC | PRN

## 2017-03-10 MED ORDER — SODIUM CHLORIDE 0.9% FLUSH: 3.0000 mL | INTRAVENOUS | Status: DC | PRN

## 2017-03-10 MED ORDER — NALOXONE HCL 2 MG/2ML IJ SOSY: 1.0000 ug/kg/h | PREFILLED_SYRINGE | INTRAVENOUS | Status: DC | PRN

## 2017-03-10 MED ORDER — AZITHROMYCIN 250 MG PO TABS
500.0000 mg | ORAL_TABLET | Freq: Every day | ORAL | Status: DC
  Administered 2017-03-10 – 2017-03-11 (×2): 500 mg via ORAL
  Filled 2017-03-10 (×2): qty 2

## 2017-03-10 MED ORDER — SODIUM CHLORIDE 0.9 % IV SOLN
2.0000 g | Freq: Four times a day (QID) | INTRAVENOUS | Status: DC
  Administered 2017-03-10 – 2017-03-11 (×5): 2 g via INTRAVENOUS
  Filled 2017-03-10 (×7): qty 2000

## 2017-03-10 MED ORDER — LACTATED RINGERS IV SOLN
INTRAVENOUS | Status: DC
  Administered 2017-03-10 (×2): via INTRAVENOUS

## 2017-03-10 MED ORDER — DIPHENHYDRAMINE HCL 50 MG/ML IJ SOLN: 12.5000 mg | INTRAMUSCULAR | Status: DC | PRN

## 2017-03-10 MED ORDER — ASPIRIN 81 MG PO CHEW
81.0000 mg | CHEWABLE_TABLET | Freq: Every day | ORAL | Status: DC
  Administered 2017-03-10 – 2017-03-11 (×2): 81 mg via ORAL
  Filled 2017-03-10 (×3): qty 1

## 2017-03-10 MED ORDER — CALCIUM CARBONATE ANTACID 500 MG PO CHEW: 2.0000 | CHEWABLE_TABLET | ORAL | Status: DC | PRN

## 2017-03-10 MED ORDER — CEFAZOLIN SODIUM-DEXTROSE 2-3 GM-% IV SOLR
INTRAVENOUS | Status: DC | PRN
  Administered 2017-03-10: 2 g via INTRAVENOUS

## 2017-03-10 MED ORDER — METFORMIN HCL 500 MG PO TABS
500.0000 mg | ORAL_TABLET | Freq: Every day | ORAL | Status: DC
  Administered 2017-03-10: 500 mg via ORAL
  Filled 2017-03-10 (×2): qty 1

## 2017-03-10 MED ORDER — NALOXONE HCL 0.4 MG/ML IJ SOLN: 0.4000 mg | INTRAMUSCULAR | Status: DC | PRN

## 2017-03-10 MED ORDER — BUPIVACAINE IN DEXTROSE 0.75-8.25 % IT SOLN
INTRATHECAL | Status: AC
  Filled 2017-03-10: qty 2

## 2017-03-10 MED ORDER — KETOROLAC TROMETHAMINE 30 MG/ML IJ SOLN: 30.0000 mg | Freq: Four times a day (QID) | INTRAMUSCULAR | Status: DC | PRN

## 2017-03-10 MED ORDER — BUPIVACAINE HCL (PF) 0.75 % IJ SOLN
INTRAMUSCULAR | Status: DC | PRN
  Administered 2017-03-10: 10 mg via INTRATHECAL

## 2017-03-10 MED ORDER — SCOPOLAMINE 1 MG/3DAYS TD PT72: 1.0000 | MEDICATED_PATCH | Freq: Once | TRANSDERMAL | Status: DC

## 2017-03-10 MED ORDER — PROGESTERONE MICRONIZED 200 MG PO CAPS
200.0000 mg | ORAL_CAPSULE | Freq: Every day | ORAL | Status: DC
  Administered 2017-03-10: 200 mg via VAGINAL
  Filled 2017-03-10: qty 1

## 2017-03-10 MED ORDER — DIPHENHYDRAMINE HCL 25 MG PO CAPS: 25.0000 mg | ORAL_CAPSULE | ORAL | Status: DC | PRN

## 2017-03-10 MED ORDER — PRENATAL MULTIVITAMIN CH
1.0000 | ORAL_TABLET | Freq: Every day | ORAL | Status: DC
  Administered 2017-03-10: 1 via ORAL
  Filled 2017-03-10: qty 1

## 2017-03-10 MED ORDER — FENTANYL CITRATE (PF) 100 MCG/2ML IJ SOLN
INTRAMUSCULAR | Status: DC | PRN
  Administered 2017-03-10: 25 ug via INTRAVENOUS
  Administered 2017-03-10: 50 ug via INTRAVENOUS
  Administered 2017-03-10: 25 ug via INTRAVENOUS

## 2017-03-10 MED ORDER — AMOXICILLIN 500 MG PO CAPS: 500.0000 mg | ORAL_CAPSULE | Freq: Three times a day (TID) | ORAL | Status: DC

## 2017-03-10 SURGICAL SUPPLY — 25 items
CANISTER SUCT 3000ML PPV (MISCELLANEOUS) ×2 IMPLANT
CLOTH BEACON ORANGE TIMEOUT ST (SAFETY) ×2 IMPLANT
COUNTER NEEDLE 1200 MAGNETIC (NEEDLE) ×2 IMPLANT
ELECT REM PT RETURN 9FT ADLT (ELECTROSURGICAL) ×2
ELECTRODE REM PT RTRN 9FT ADLT (ELECTROSURGICAL) IMPLANT
GLOVE BIO SURGEON STRL SZ7 (GLOVE) ×2 IMPLANT
GLOVE BIOGEL PI IND STRL 7.0 (GLOVE) ×2 IMPLANT
GLOVE BIOGEL PI INDICATOR 7.0 (GLOVE) ×2
GOWN STRL REUS W/TWL LRG LVL3 (GOWN DISPOSABLE) ×4 IMPLANT
NDL MAYO CATGUT SZ4 TPR NDL (NEEDLE) ×1 IMPLANT
NEEDLE MAYO CATGUT SZ4 (NEEDLE) ×2 IMPLANT
NS IRRIG 1000ML POUR BTL (IV SOLUTION) ×2 IMPLANT
PACK VAGINAL MINOR WOMEN LF (CUSTOM PROCEDURE TRAY) ×2 IMPLANT
PAD OB MATERNITY 4.3X12.25 (PERSONAL CARE ITEMS) ×2 IMPLANT
PAD PREP 24X48 CUFFED NSTRL (MISCELLANEOUS) ×2 IMPLANT
PENCIL BUTTON HOLSTER BLD 10FT (ELECTRODE) ×1 IMPLANT
SUT MERSILENE 5MM BP 1 12 (SUTURE) IMPLANT
SUT PROLENE 1 CT 1 30 (SUTURE) ×1 IMPLANT
SUT VIC AB 2-0 SH 27 (SUTURE) ×2
SUT VIC AB 2-0 SH 27XBRD (SUTURE) IMPLANT
SYR BULB IRRIGATION 50ML (SYRINGE) ×2 IMPLANT
TOWEL OR 17X24 6PK STRL BLUE (TOWEL DISPOSABLE) ×4 IMPLANT
TRAY FOLEY CATH SILVER 14FR (SET/KITS/TRAYS/PACK) ×2 IMPLANT
TUBING NON-CON 1/4 X 20 CONN (TUBING) ×1 IMPLANT
YANKAUER SUCT BULB TIP NO VENT (SUCTIONS) ×1 IMPLANT

## 2017-03-10 NOTE — Op Note (Signed)
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 At 18 weeks she had normal cervical length of 3.8 cm. She had no symptoms when she presented today but sonogram noted complete funneling of cervix with the closed past measuring 1.7 mm. She was advised emergency cerclage, admitted to hospital after that for observation. Risks/ complications reviewed as in H&P and informed written consent was obtained.  Patient was brought to the operating room with IV running. She received preop 2 gm Ancef. She underwent Spinal anesthesia without complications. She was given dorsolithotomy position. Parts were prepped and draped in standard fashion. Bladder was catheterized once. Speculum was placed and cervix was evaluated. A large fleshy vertical vaginal septum was noted from lower third of the vagina that was not identified before and this was not attached to the cervix. Cervical exam and cerclage was carried from her left half of the vagina. External os appeared normal but pin point open and membranes versus mucus was seen just inside the external os. A #16 foley catheter tip was gently inserted to reduce membranes if present and foley was removed.  Bladder reflection evaluated. McDonald cerclage performed using Prolene starting at 1 o'clock and going in anti-clock direction taking regular purse string stitch while grasping cervix with ring forceps. Knot tied at 1 o'clock. Hemostasis was noted. Cervix felt well secured  with cerclage Now the vaginal septum was tented with a finer under it and with Bovie it was cauterized and cut and raw edges were sutured with 2-0 Vicryl for hemostasis.   Instruments removed. All counts correct x2.  Pt was brought the PACU in good condition and will be transferred back to the floor for antibiotics and monitoring. Patient tolerated surgery well.   V.Kaire Stary, MD.

## 2017-03-10 NOTE — Anesthesia Postprocedure Evaluation (Signed)
Anesthesia Post Note  Patient: Janet Deleon  Procedure(s) Performed: Procedure(s) (LRB): Emergency CERVICAL CERCLAGE, Excision Vaginal Septum (N/A)     Patient location during evaluation: PACU Anesthesia Type: Spinal Level of consciousness: oriented and awake and alert Pain management: pain level controlled Vital Signs Assessment: post-procedure vital signs reviewed and stable Respiratory status: spontaneous breathing, respiratory function stable and patient connected to nasal cannula oxygen Cardiovascular status: blood pressure returned to baseline and stable Postop Assessment: no headache and no backache Anesthetic complications: no    Last Vitals:  Vitals:   03/10/17 1500 03/10/17 1515  BP: 124/68 130/83  Pulse: 85 88  Resp: 19 18  Temp:      Last Pain:  Vitals:   03/10/17 1105  PainSc: 0-No pain   Pain Goal:                 Adger Cantera

## 2017-03-10 NOTE — Anesthesia Procedure Notes (Signed)
Spinal  Patient location during procedure: OR Start time: 03/10/2017 1:01 PM End time: 03/10/2017 1:09 PM Staffing Anesthesiologist: Thersa Mohiuddin Preanesthetic Checklist Completed: patient identified, site marked, surgical consent, pre-op evaluation, timeout performed, IV checked, risks and benefits discussed and monitors and equipment checked Spinal Block Patient position: sitting Prep: DuraPrep Patient monitoring: heart rate, cardiac monitor, continuous pulse ox and blood pressure Approach: midline Location: L3-4 Injection technique: single-shot Needle Needle type: Sprotte  Needle gauge: 24 G Needle length: 9 cm Needle insertion depth: 9 cm Assessment Sensory level: T10

## 2017-03-10 NOTE — Anesthesia Postprocedure Evaluation (Signed)
Anesthesia Post Note  Patient: Janet Deleon  Procedure(s) Performed: Procedure(s) (LRB): Emergency CERVICAL CERCLAGE, Excision Vaginal Septum (N/A)     Patient location during evaluation: Mother Baby Anesthesia Type: Spinal Level of consciousness: awake and alert and oriented Pain management: satisfactory to patient Vital Signs Assessment: post-procedure vital signs reviewed and stable Respiratory status: respiratory function stable and spontaneous breathing Cardiovascular status: blood pressure returned to baseline Postop Assessment: no headache, no backache, spinal receding, patient able to bend at knees and adequate PO intake Anesthetic complications: no    Last Vitals:  Vitals:   03/10/17 1630 03/10/17 1730  BP: 127/77 111/69  Pulse: 93 90  Resp: 18 20  Temp: 36.9 C 36.8 C    Last Pain:  Vitals:   03/10/17 1730  TempSrc: Oral  PainSc:    Pain Goal:                 Janet Deleon

## 2017-03-10 NOTE — H&P (Signed)
Janet Deleon is a 36 y.o. female 21.4 wks, presenting for emergency cerclage. G1, Femara pregnancy for PCOS, well controlled HTN. Had normal pregnancy course thus far and had normal but incomplete anatomy sono at 18 wks with CL 3.8 cm. She presented for f/up sono to complete anatomy and CL was noted to be 1.7 mm.   Denies cervical surgery incl LEEP/ Cryo/D&C Denies pain/ bleeding/ cramping. No STDs in pregnancy  OB History    Gravida Para Term Preterm AB Living   1             SAB TAB Ectopic Multiple Live Births                 Past Medical History:  Diagnosis Date  . Anemia    Past Surgical History:  Procedure Laterality Date  . lt eye surgery    . OVARIAN CYST REMOVAL     1996  . TONSILLECTOMY     Family History: family history is not on file. Social History:  reports that she has quit smoking. She has never used smokeless tobacco. She reports that she does not drink alcohol or use drugs.   ROS no dysuria/ CP/SOB/fever History   Blood pressure 113/85, pulse 92, temperature 98.3 F (36.8 C), resp. rate 18, height 5\' 2"  (1.575 m), weight 264 lb (119.7 kg), last menstrual period 10/10/2016. Exam Physical Exam  Physical exam:  A&O x 3, no acute distress. Pleasant HEENT neg, no thyromegaly Lungs CTA bilat CV RRR, S1S2 normal Abdo soft, non tender, non acute Extr no edema/ tenderness Pelvic external os closed, no membranes note, short cervix FHT 140s   ABO, Rh: --/--/O POS (04/01 1507)   Assessment/Plan: 21.4 wks. Short cervix at 1.7 mm, incidental finding on sono today Spoke with MFM Dr Lucia Gaskins on phone. Agrees with emergency cerclage, planning McDonald.  Recommended 72 hrs of Azithromycin PO per schedule and Motrin 600mg  po q 6 per schedule for 72 hrs as well.  Also start Vaginal Prometrium 200 mg at night. Keep in observation overnight.   Risks/complications of surgery reviewed incl infection, bleeding, damage to internal organs including bladder, bowels, ureters,  blood vessels, other risks from anesthesia, VTE and delayed complications of any surgery, complications in future surgery reviewed.  Mainly reviewed risk of doing nothing, doing only vaginal Prometrium, doing cerclage and risks/ benefits of cerclage including ROM and loss pr pregnancy or cerclage not holding up as well as we like. She voiced understanding   Janet Deleon R 03/10/2017, 11:19 AM

## 2017-03-10 NOTE — Transfer of Care (Signed)
Immediate Anesthesia Transfer of Care Note  Patient: Janet Deleon  Procedure(s) Performed: Procedure(s) with comments: Emergency CERVICAL CERCLAGE, Excision Vaginal Septum (N/A) - EDD: 07/17/17 Allergy: Sulfa, Demerol  Patient Location: PACU  Anesthesia Type:Spinal  Level of Consciousness: awake, alert , oriented and patient cooperative  Airway & Oxygen Therapy: Patient Spontanous Breathing  Post-op Assessment: Report given to RN and Post -op Vital signs reviewed and stable  Post vital signs: Reviewed and stable 119/69, 86 NSR, 98% spo2  Last Vitals:  Vitals:   03/10/17 1106 03/10/17 1117  BP:  113/85  Pulse:  92  Resp: 18   Temp: 36.8 C     Last Pain:  Vitals:   03/10/17 1105  PainSc: 0-No pain         Complications: No apparent anesthesia complications

## 2017-03-10 NOTE — Anesthesia Preprocedure Evaluation (Signed)
Anesthesia Evaluation  Patient identified by MRN, date of birth, ID band Patient awake    Reviewed: Allergy & Precautions, H&P , NPO status , Patient's Chart, lab work & pertinent test results, reviewed documented beta blocker date and time   Airway Mallampati: II  TM Distance: >3 FB Neck ROM: full    Dental no notable dental hx.    Pulmonary neg pulmonary ROS, former smoker,    Pulmonary exam normal breath sounds clear to auscultation       Cardiovascular hypertension, Normal cardiovascular exam Rhythm:regular Rate:Normal     Neuro/Psych negative neurological ROS  negative psych ROS   GI/Hepatic negative GI ROS, Neg liver ROS,   Endo/Other  diabetes, Type 2  Renal/GU negative Renal ROS  negative genitourinary   Musculoskeletal   Abdominal   Peds  Hematology  (+) anemia ,   Anesthesia Other Findings   Reproductive/Obstetrics (+) Pregnancy                             Anesthesia Physical Anesthesia Plan  ASA: III  Anesthesia Plan: Spinal   Post-op Pain Management:    Induction:   PONV Risk Score and Plan: 2 and Ondansetron and Dexamethasone  Airway Management Planned:   Additional Equipment:   Intra-op Plan:   Post-operative Plan:   Informed Consent: I have reviewed the patients History and Physical, chart, labs and discussed the procedure including the risks, benefits and alternatives for the proposed anesthesia with the patient or authorized representative who has indicated his/her understanding and acceptance.     Plan Discussed with: CRNA  Anesthesia Plan Comments:         Anesthesia Quick Evaluation

## 2017-03-10 NOTE — Progress Notes (Signed)
I met with Janet Deleon before her procedure this morning.  She and her family were in good spirits.  They were concerned about the baby, but they were feeling good about the procedure and she was very committed to do whatever was needed to take care of her baby.    They are moving out of her parents home this weekend, but they have others to help with the move.  Her parents will still be 5 min away if she has any needs while on bed rest.  Chaplain Janne Napoleon, [place] Pager, [phone] 4:04 PM    03/10/17 1600  Clinical Encounter Type  Visited With Patient and family together  Visit Type Spiritual support  Referral From Nurse

## 2017-03-10 NOTE — Addendum Note (Signed)
Addendum  created 03/10/17 1842 by Flossie Dibble, CRNA   Sign clinical note

## 2017-03-11 ENCOUNTER — Encounter (HOSPITAL_COMMUNITY): Payer: Self-pay | Admitting: Obstetrics & Gynecology

## 2017-03-11 DIAGNOSIS — O162 Unspecified maternal hypertension, second trimester: Secondary | ICD-10-CM | POA: Diagnosis not present

## 2017-03-11 DIAGNOSIS — O3432 Maternal care for cervical incompetence, second trimester: Secondary | ICD-10-CM | POA: Diagnosis not present

## 2017-03-11 DIAGNOSIS — O99012 Anemia complicating pregnancy, second trimester: Secondary | ICD-10-CM | POA: Diagnosis not present

## 2017-03-11 DIAGNOSIS — O3480 Maternal care for other abnormalities of pelvic organs, unspecified trimester: Secondary | ICD-10-CM | POA: Diagnosis not present

## 2017-03-11 DIAGNOSIS — E282 Polycystic ovarian syndrome: Secondary | ICD-10-CM | POA: Diagnosis not present

## 2017-03-11 DIAGNOSIS — Z9889 Other specified postprocedural states: Secondary | ICD-10-CM | POA: Diagnosis not present

## 2017-03-11 DIAGNOSIS — Z7982 Long term (current) use of aspirin: Secondary | ICD-10-CM | POA: Diagnosis not present

## 2017-03-11 DIAGNOSIS — Z87891 Personal history of nicotine dependence: Secondary | ICD-10-CM | POA: Diagnosis not present

## 2017-03-11 DIAGNOSIS — O24912 Unspecified diabetes mellitus in pregnancy, second trimester: Secondary | ICD-10-CM | POA: Diagnosis not present

## 2017-03-11 DIAGNOSIS — Z3A21 21 weeks gestation of pregnancy: Secondary | ICD-10-CM | POA: Diagnosis not present

## 2017-03-11 DIAGNOSIS — Z7984 Long term (current) use of oral hypoglycemic drugs: Secondary | ICD-10-CM | POA: Diagnosis not present

## 2017-03-11 MED ORDER — IBUPROFEN 600 MG PO TABS: 600.0000 mg | ORAL_TABLET | Freq: Three times a day (TID) | ORAL | 0 refills | Status: AC

## 2017-03-11 MED ORDER — DOCUSATE SODIUM 100 MG PO CAPS: 100.0000 mg | ORAL_CAPSULE | Freq: Every day | ORAL | 0 refills | Status: DC

## 2017-03-11 MED ORDER — AZITHROMYCIN 250 MG PO TABS: ORAL_TABLET | ORAL | 0 refills | Status: DC

## 2017-03-11 MED ORDER — AMOXICILLIN 500 MG PO CAPS: 500.0000 mg | ORAL_CAPSULE | Freq: Three times a day (TID) | ORAL | 0 refills | Status: AC

## 2017-03-11 MED ORDER — PROGESTERONE MICRONIZED 200 MG PO CAPS
200.0000 mg | ORAL_CAPSULE | Freq: Every day | ORAL | 1 refills | Status: DC
Start: 2017-03-11 — End: 2017-07-06

## 2017-03-11 NOTE — Progress Notes (Signed)
Discharge teaching complete with pt. Pt understood all information and did not have any questions. Pt discharged home to family. 

## 2017-03-11 NOTE — Progress Notes (Signed)
Subjective: No complaints   Objective: Vital signs in last 24 hours: Temp:  [97.7 F (36.5 C)-99.3 F (37.4 C)] 99.3 F (37.4 C) (07/03 1200) Pulse Rate:  [77-108] 102 (07/03 1200) Resp:  [15-23] 20 (07/03 1200) BP: (98-131)/(49-95) 102/62 (07/03 1200) SpO2:  [95 %-100 %] 95 % (07/03 1200) Weight change:  Last BM Date: 03/11/17  Intake/Output from previous day: 07/02 0701 - 07/03 0700 In: 1300 [I.V.:1300] Out: 1310 [Urine:1300; Blood:10] Intake/Output this shift: No intake/output data recorded.  General appearance: alert and cooperative GI: soft, non-tender; bowel sounds normal; no masses,  no organomegaly, normal soft gravid uterus Ext no c/c/e  Assessment/Plan:   LOS: 0 days  Emergency cercalge - [facility] at 21.4 wks.  Stable Pelvic rest, modified bedrest Azithromycin 2 more days Motrin 600mg  a 8 hrs x till tomorrow F/up Dr Benjie Karvonen 1 wk [facility], SAB precautions   Aliena Ghrist R 03/11/2017, 1:10 PM  Patient ID: Janet Deleon, female   DOB: [birth year], 36 y.o.   MRN: [id number]

## 2017-03-11 NOTE — Discharge Summary (Signed)
Physician Discharge Summary  Patient ID: Janet Deleon MRN: [id number] DOB/AGE: 38-Aug-1982 38 y.o.  Admit date: 03/10/2017 Discharge date: 03/11/2017  Admission Diagnoses: 21.4 wks, incidental finding of cervical incompetence  Discharge Diagnoses: 21.4 wks cervical incompetence, S/p emergency cervical cerclage by Sherryle Lis' method                                         Vertical vaginal septum s/p resection   Discharged Condition: good  Hospital Course: Patient was started on Ampicillin/ Azithromycin and Motrin and vaginal Prometrium while in the hospital and remained stable after surgery with no evidence of infection or preterm contractions. Reviewed findings and plan weekly CL in office.   Discharge Exam: Blood pressure 102/62, pulse (!) 102, temperature 99.3 F (37.4 C), temperature source Oral, resp. rate 20, height 5\' 2"  (1.575 m), weight 264 lb (119.7 kg), last menstrual period 10/10/2016, SpO2 95 %. General appearance: alert and cooperative Resp: clear to auscultation bilaterally Cardio: regular rate and rhythm, S1, S2 normal, no murmur, click, rub or gallop GI: soft, non-tender; bowel sounds normal; no masses,  no organomegaly Extremities: extremities normal, atraumatic, no cyanosis or edema Gravid soft uterus, FHT 145   Disposition: 01-Home or Self Care  Discharge Instructions    Call MD for:    Complete by:  As directed    Vaginal bleeding, fluid leakage, painful contractions   Call MD for:  temperature >100.4    Complete by:  As directed    Diet - low sodium heart healthy    Complete by:  As directed    Discharge instructions    Complete by:  As directed    Pelvic rest, modified bedrest Ampicillin and Azithromycin 2 more days Motrin 600mg  a 8 hrs x till tomorrow Prometrium 200mg  vaginally until 36 weeks  Continue Labetalol, baby Aspirin and Metformin as usual F/up Dr Benjie Karvonen 1 wk   Driving Restrictions    Complete by:  As directed    2 weeks   Increase activity slowly     Complete by:  As directed    Lifting restrictions    Complete by:  As directed    10 lbs until delivery   Sexual Activity Restrictions    Complete by:  As directed    Until delivery     Allergies as of 03/11/2017      Reactions   Demerol [meperidine] Nausea And Vomiting, Other (See Comments)   Reaction:  High fever   Sulfa Antibiotics Hives      Medication List    TAKE these medications   amoxicillin 500 MG capsule Commonly known as:  AMOXIL Take 1 capsule (500 mg total) by mouth every 8 (eight) hours. Start taking on:  03/12/2017   aspirin 81 MG chewable tablet Chew 81 mg by mouth daily.   azithromycin 250 MG tablet Commonly known as:  ZITHROMAX 500 mg daily for 2 more days Start taking on:  03/12/2017   docusate sodium 100 MG capsule Commonly known as:  COLACE Take 1 capsule (100 mg total) by mouth daily. Start taking on:  03/12/2017   ibuprofen 600 MG tablet Commonly known as:  ADVIL,MOTRIN Take 1 tablet (600 mg total) by mouth every 8 (eight) hours.   labetalol 100 MG tablet Commonly known as:  NORMODYNE Take 100 mg by mouth 2 (two) times daily.   metFORMIN 500 MG tablet Commonly known as:  GLUCOPHAGE Take 500 mg by mouth at bedtime.   prenatal multivitamin Tabs tablet Take 1 tablet by mouth at bedtime.   progesterone 200 MG capsule Commonly known as:  PROMETRIUM Place 1 capsule (200 mg total) vaginally at bedtime.      Follow-up Information    Azucena Fallen, MD Follow up in 1 week(s).   Specialty:  Obstetrics and Gynecology Contact information: [address] [place] [address] [phone]           Signed: Elveria Royals 03/11/2017, 2:11 PM

## 2017-03-19 DIAGNOSIS — Z3A22 22 weeks gestation of pregnancy: Secondary | ICD-10-CM | POA: Diagnosis not present

## 2017-03-19 DIAGNOSIS — O3432 Maternal care for cervical incompetence, second trimester: Secondary | ICD-10-CM | POA: Diagnosis not present

## 2017-03-27 DIAGNOSIS — O3430 Maternal care for cervical incompetence, unspecified trimester: Secondary | ICD-10-CM | POA: Diagnosis not present

## 2017-03-27 DIAGNOSIS — Z3A23 23 weeks gestation of pregnancy: Secondary | ICD-10-CM | POA: Diagnosis not present

## 2017-03-28 DIAGNOSIS — Z3A24 24 weeks gestation of pregnancy: Secondary | ICD-10-CM | POA: Diagnosis not present

## 2017-03-28 DIAGNOSIS — O3430 Maternal care for cervical incompetence, unspecified trimester: Secondary | ICD-10-CM | POA: Diagnosis not present

## 2017-04-04 DIAGNOSIS — Z3A25 25 weeks gestation of pregnancy: Secondary | ICD-10-CM | POA: Diagnosis not present

## 2017-04-04 DIAGNOSIS — O3432 Maternal care for cervical incompetence, second trimester: Secondary | ICD-10-CM | POA: Diagnosis not present

## 2017-04-04 DIAGNOSIS — Z364 Encounter for antenatal screening for fetal growth retardation: Secondary | ICD-10-CM | POA: Diagnosis not present

## 2017-04-07 ENCOUNTER — Other Ambulatory Visit (HOSPITAL_COMMUNITY): Payer: Self-pay | Admitting: Obstetrics & Gynecology

## 2017-04-07 DIAGNOSIS — Z3689 Encounter for other specified antenatal screening: Secondary | ICD-10-CM

## 2017-04-11 ENCOUNTER — Ambulatory Visit (HOSPITAL_COMMUNITY)
Admission: RE | Admit: 2017-04-11 | Discharge: 2017-04-11 | Disposition: A | Discharge status: Home / Self Care | Payer: BLUE CROSS/BLUE SHIELD | Source: Ambulatory Visit | Attending: Obstetrics & Gynecology | Admitting: Obstetrics & Gynecology

## 2017-04-11 ENCOUNTER — Other Ambulatory Visit (HOSPITAL_COMMUNITY): Payer: Self-pay | Admitting: Obstetrics & Gynecology

## 2017-04-11 ENCOUNTER — Other Ambulatory Visit (HOSPITAL_COMMUNITY): Payer: Self-pay | Admitting: *Deleted

## 2017-04-11 ENCOUNTER — Encounter (HOSPITAL_COMMUNITY): Payer: Self-pay

## 2017-04-11 DIAGNOSIS — E669 Obesity, unspecified: Secondary | ICD-10-CM | POA: Insufficient documentation

## 2017-04-11 DIAGNOSIS — O99212 Obesity complicating pregnancy, second trimester: Secondary | ICD-10-CM

## 2017-04-11 DIAGNOSIS — Z3A26 26 weeks gestation of pregnancy: Secondary | ICD-10-CM

## 2017-04-11 DIAGNOSIS — O350XX0 Maternal care for (suspected) central nervous system malformation in fetus, not applicable or unspecified: Secondary | ICD-10-CM | POA: Diagnosis not present

## 2017-04-11 DIAGNOSIS — Q02 Microcephaly: Secondary | ICD-10-CM

## 2017-04-11 DIAGNOSIS — O09522 Supervision of elderly multigravida, second trimester: Secondary | ICD-10-CM

## 2017-04-11 DIAGNOSIS — Z6841 Body Mass Index (BMI) 40.0 and over, adult: Secondary | ICD-10-CM | POA: Diagnosis not present

## 2017-04-11 DIAGNOSIS — Z3689 Encounter for other specified antenatal screening: Secondary | ICD-10-CM

## 2017-04-11 DIAGNOSIS — O3507X0 Maternal care for (suspected) central nervous system malformation or damage in fetus, microcephaly, not applicable or unspecified: Secondary | ICD-10-CM

## 2017-04-11 DIAGNOSIS — O10919 Unspecified pre-existing hypertension complicating pregnancy, unspecified trimester: Secondary | ICD-10-CM

## 2017-04-11 DIAGNOSIS — O3482 Maternal care for other abnormalities of pelvic organs, second trimester: Secondary | ICD-10-CM | POA: Diagnosis not present

## 2017-04-11 DIAGNOSIS — O3432 Maternal care for cervical incompetence, second trimester: Secondary | ICD-10-CM | POA: Diagnosis not present

## 2017-04-11 DIAGNOSIS — O365920 Maternal care for other known or suspected poor fetal growth, second trimester, not applicable or unspecified: Secondary | ICD-10-CM | POA: Insufficient documentation

## 2017-04-11 DIAGNOSIS — E282 Polycystic ovarian syndrome: Secondary | ICD-10-CM | POA: Diagnosis not present

## 2017-04-11 DIAGNOSIS — O10912 Unspecified pre-existing hypertension complicating pregnancy, second trimester: Secondary | ICD-10-CM | POA: Insufficient documentation

## 2017-04-11 DIAGNOSIS — O10012 Pre-existing essential hypertension complicating pregnancy, second trimester: Secondary | ICD-10-CM | POA: Diagnosis not present

## 2017-04-11 DIAGNOSIS — O358XX0 Maternal care for other (suspected) fetal abnormality and damage, not applicable or unspecified: Secondary | ICD-10-CM | POA: Diagnosis not present

## 2017-04-11 NOTE — Progress Notes (Signed)
MFM Consult:  Impressions: SIUP at [redacted]w[redacted]d, obesity, HTN, CI active singleton fetus breech presentation EFW 25th%'le HC and BPD are both >2SD below the mean and lag 3-4 weeks behind --> microcephaly normal intracranial anatomy no other dysmorphic features seen AFI is normal no previa low risk NIPS (Informaseq) cerclage seen and intact cervix is closed but funneled to cerclage with functional closed length of 1.4cm internal debris within funnel   Discussion:  Microcephaly:  Fetal survey and biometry reveals small head circumference (~2SD below the mean). While today's measurement is in context of maternal obesity with a noncephalic fetus in breech presentation (suboptimal images to measure), I did discuss the potential of microcephaly in context of apparently normal cranial contour and intracranial anatomy.  Surveillance is warranted antenatally with serial cephalometry (monthly measurements of HC, BPD, lateral ventricles, and cerebellum) and interval fetal anatomy survey with particular attention to head anatomy to assist in diagnosis of a possible evolving defect; eg, lack of appropriate interval head/cerebellar growth or alternatively increase in lateral ventricles would then prompt further workup (eg, fetal MRI and/or pediatric neurosurgery consultation for hydrocephalus). Some series suggest that up to 10% of cases of microcephaly may ultimately be affected with mild to moderate neuro-developmental delay, meaning most cases (90%) are normal variants and have normal IQ.  Your patient was offered genetic counseling/testing (eg,expanded carrier screening, namely the Counsel screen) today and accepted.  Blood was drawn on both parental entities and results will be reviewed when available in 10-14 days.     Chronic hypertension (CHTN): During our discussion, I reviewed hypertension as a cause of uteroplacental insufficiency, with increased risk of IUGR, oligohydramnios, and stillbirth. I told  her that her hypertension also places her at increased risk for preeclampsia, describing the triad of increased blood pressure, proteinuria, and abnormal edema. Lastly, hypertension (severe range) increases the risk of placental abruption, especially in the setting of superimposed preeclampsia.   I reviewed the essential tenets in the most recent guidelines for management of hypertension in pregnancy in accordance with the [employer] of Obstetrics and Gynecology expert opinion. We talked about the medical treatment of hypertension in pregnancy. I outlined the different classes of medications, emphasizing that angiotensin enzyme inhibitors and angoitensin receptor blockers are contraindicated, and diuretics are relatively contraindicated. I told her that beta-blockers and calcium channel blockers are commonly used to treat hypertension in pregnancy, and that both are felt to be safe for use in pregnancy. ?  Her dose of labetalol is adequate in current maintainance of her blood pressure in pregnancy as evidenced by BP today. I outlined the usual plan of management for hypertension in pregnancy. She should have her blood pressure carefully followed, and her medications adjusted to keep her BP in the target range of around 130-159/70-109 mm/Hg; ie, HTN should not be treated (no medication adjustment) until 160/110 or greater measurements for blood pressures to be consistent with current ACOG/SMFM guidelines (ie, guidelines are 160/110 in absence of renal/cardiac disease during pregnancy). ? Cervical insufficiency:  She has had a successful cerclage and is taking prometrium vaginally.  I recommend removal of the cerclage at 36-37 wks and continued prometrium vaginally until 36 weeks.  Recommendations: -CI:  Continued prometrium qhs until 36 weeks.  Preterm labor precautions.  Removal of cerclage at 36-37 weeks  -Microcephaly:    -interval cephalometry in 4 weeks  - Counsel test (expanded carrier  screening drawn on both parental entities today and pending); see genetic counseling  -notification of microcephaly to pediatrics at  delivery   -CHTN   -continued interval growth monthly  -treat severe range HTN if noted and correlate with w/u for superimposed preeclampsia if such onset is encountered  -warrants antenatal testing at 32 weeks   -delivery at 38-39 weeks  Time Spent:  I spent in excess of 40 minutes in consultation with this patient to review records, evaluate her case, and provide her with an adequate discussion and education. More than 50% of this time was spent in direct face-to-face counseling.  ? It was a pleasure seeing your patient in the office today. Thank you for consultation. Please do not hesitate to contact our service for any further questions.  ?  Marijean Bravo, MD, MS, [place]  Section of Maternal-Fetal Medicine, [facility]

## 2017-04-18 DIAGNOSIS — Z364 Encounter for antenatal screening for fetal growth retardation: Secondary | ICD-10-CM | POA: Diagnosis not present

## 2017-04-18 DIAGNOSIS — O3432 Maternal care for cervical incompetence, second trimester: Secondary | ICD-10-CM | POA: Diagnosis not present

## 2017-04-18 DIAGNOSIS — Z3A27 27 weeks gestation of pregnancy: Secondary | ICD-10-CM | POA: Diagnosis not present

## 2017-04-18 DIAGNOSIS — Z23 Encounter for immunization: Secondary | ICD-10-CM | POA: Diagnosis not present

## 2017-04-21 ENCOUNTER — Other Ambulatory Visit: Payer: Self-pay

## 2017-04-21 DIAGNOSIS — Z31430 Encounter of female for testing for genetic disease carrier status for procreative management: Secondary | ICD-10-CM | POA: Diagnosis not present

## 2017-04-21 DIAGNOSIS — Z3490 Encounter for supervision of normal pregnancy, unspecified, unspecified trimester: Secondary | ICD-10-CM | POA: Diagnosis not present

## 2017-04-21 DIAGNOSIS — Z1371 Encounter for nonprocreative screening for genetic disease carrier status: Secondary | ICD-10-CM | POA: Diagnosis not present

## 2017-04-28 ENCOUNTER — Telehealth (HOSPITAL_COMMUNITY): Payer: Self-pay | Admitting: Genetics

## 2017-04-28 NOTE — Telephone Encounter (Signed)
Called Janet Deleon to review the results of her [employer] expanded carrier screening.  THere was no answer.  A message was left on her personalized voicemail.

## 2017-05-01 DIAGNOSIS — O3433 Maternal care for cervical incompetence, third trimester: Secondary | ICD-10-CM | POA: Diagnosis not present

## 2017-05-01 DIAGNOSIS — Z3A29 29 weeks gestation of pregnancy: Secondary | ICD-10-CM | POA: Diagnosis not present

## 2017-05-01 DIAGNOSIS — Z3689 Encounter for other specified antenatal screening: Secondary | ICD-10-CM | POA: Diagnosis not present

## 2017-05-06 ENCOUNTER — Telehealth (HOSPITAL_COMMUNITY): Payer: Self-pay | Admitting: Genetics

## 2017-05-06 NOTE — Telephone Encounter (Signed)
Left another message for Ms. Janet Deleon to return my call regarding her results.  Have now left 3 unanswered messages.  Stated that if she was unable to return my call prior to Friday, we could talk after her ultrasound on Friday.  Appointment made for genetic counseling on Friday at 9 am.

## 2017-05-09 ENCOUNTER — Ambulatory Visit (HOSPITAL_COMMUNITY)
Admission: RE | Admit: 2017-05-09 | Discharge: 2017-05-09 | Disposition: A | Discharge status: Home / Self Care | Payer: BLUE CROSS/BLUE SHIELD | Source: Ambulatory Visit | Attending: Obstetrics & Gynecology | Admitting: Obstetrics & Gynecology

## 2017-05-09 ENCOUNTER — Other Ambulatory Visit (HOSPITAL_COMMUNITY): Payer: Self-pay | Admitting: Obstetrics and Gynecology

## 2017-05-09 ENCOUNTER — Encounter (HOSPITAL_COMMUNITY): Payer: Self-pay

## 2017-05-09 DIAGNOSIS — O163 Unspecified maternal hypertension, third trimester: Secondary | ICD-10-CM | POA: Diagnosis not present

## 2017-05-09 DIAGNOSIS — Q02 Microcephaly: Secondary | ICD-10-CM

## 2017-05-09 DIAGNOSIS — O99213 Obesity complicating pregnancy, third trimester: Secondary | ICD-10-CM | POA: Insufficient documentation

## 2017-05-09 DIAGNOSIS — Z148 Genetic carrier of other disease: Secondary | ICD-10-CM | POA: Diagnosis not present

## 2017-05-09 DIAGNOSIS — O09513 Supervision of elderly primigravida, third trimester: Secondary | ICD-10-CM

## 2017-05-09 DIAGNOSIS — Z3A30 30 weeks gestation of pregnancy: Secondary | ICD-10-CM | POA: Insufficient documentation

## 2017-05-09 DIAGNOSIS — O10013 Pre-existing essential hypertension complicating pregnancy, third trimester: Secondary | ICD-10-CM | POA: Diagnosis not present

## 2017-05-09 NOTE — Addendum Note (Signed)
Encounter addended by: Corinne Ports on: 05/09/2017  2:26 PM<BR>    Actions taken: Chief Complaint modified, Visit diagnoses modified, Episode edited, Problem List modified, Letter status changed, Sign clinical note, Charge Capture section accepted

## 2017-05-09 NOTE — Progress Notes (Signed)
Genetic Counseling  High-Risk Gestation Note  Appointment Date:  05/09/2017 Referred By: Doree Albee, MD Date of Birth:  [birth year] Partner: Modesto Charon   Pregnancy History: G1P0 Estimated Date of Delivery: 07/17/17 Estimated Gestational Age: 54w1dAttending: JViann Fish MD   I met with [name] her husband, Mr. 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  This variant is not expected to be associated with profound biotinidase deficiency  1 in 4 (25%) chance for current pregnancy to be homozygous  Homozygotes for D444H typically have 45-50% biotinidase activity and are unlikely to be affected with biotinidase deficiency   Discussed option of prenatal diagnosis  Declined amniocentesis at this time  Biotinidase deficiency is included on the newborn screening panel   Follow-up ultrasound today given previous concern for microcephaly  Fetal head on today's ultrasound does not meet clinical criteria for microcephaly- see separate report  Follow-up ultrasound 06/06/17  No other family history concerns  [name] Mr. Janet Forbushhad expanded carrier screening through [facility]. This screening identified both of them as carriers for biotinidase deficiency.   We began by reviewing the process of expanded carrier screening and autosomal recessive inheritance. We reviewed genes and chromosomes. We discussed that each person is estimated to have 7-10 genes that do not work correctly, meaning that each person is estimated to be a carrier of 7-10 different genetic conditions. The majority of these conditions follow  autosomal recessive inheritance. We reviewed that we each have two copies of all of our genes, one inherited from each parent. In a recessive condition, if one copy of the pair of genes is changed in a way that causes it not to function properly, but the other copy of the gene works, a person is considered to be a "carrier" for that condition. As carriers have a functioning gene, they typically do not have any health problems related to the non-working gene(s). If both parents are carriers for the same recessive condition, there is a 1 in 4 (25%) chance with each pregnancy to have a child who receives both non-working genes and is affected with that specific condition.   Historically, carrier testing was offered to individuals based on their ethnicity, as there are certain genetic conditions that occur more frequently in specific ethnic groups. However, we are learning that with time, this distinction is less clear as many people are multi-racial and have multiple ethnic backgrounds. Thus, the expanded carrier screen offered by [employer] offers carrier screening for the same panel of conditions to all individuals. The conditions included on the screen are those which are well described and have significant disease severity.  Carrier screening for Mrs. Janet Hoardand Mr. 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  We discussed that this condition follows an autosomal recessive pattern of inheritance. Thus, being a carrier would not be sufficient for symptom development. Screening for the remaining 174 conditions was within normal limits, significantly reducing their risks to be carriers for these additional conditions, listed separately on the laboratory report.   Biotinidase deficiency is characterized by  inability to process biotin due to enzymatic deficiency. Profound biotinidase deficiency  is defined as less than 10% mean normal serum biotinidase activity, and partial biotinidase deficiency is characterized by 10-30% of normal serum biotinidase activity. If untreated, symptoms can include neurologic features, with partial biotinidase deficiency associated with less severe symptoms or symptoms only during times of stress. Individuals who are homozygous for D444H, which is what the couple's offspring have a 25% chance for, typically have 45%-50% of mean normal biotinidase enzyme activity, which is similar to the activity of carriers of profound biotinidase deficiency, meaning they are unlikely to experience clinical symptoms.    We discussed that biotinidase deficiency is highly treatable, and management includes biotin supplementation. The treatment is lifelong and if started prior to symptoms, typically prevents symptoms. However, being homozygous for the particular variant the couple carries (D444H), often may not require biotin therapy.  We discussed that biotinidase deficiency is included on the newborn screening panel in [place]. We discussed that prenatal diagnosis is available via amniocentesis. The risks, benefits, and limitations of amniocentesis were discussed. [name] declined amniocentesis.   We discussed that her results for the remaining 174 conditions are negative, indicating that she does not have a detectable gene alteration in any of the genes for which analysis was performed. We reviewed that carrier screening does not detect all carriers of these conditions, but a normal result significantly decreases the likelihood of being a carrier, and therefore, the overall reproductive risk. We reviewed that [employer] sequences most of the genes, which is associated with a high detection rate for carriers, thus a negative screen is very reassuring. All questions were answered to her satisfaction, she was encouraged to call with additional questions or concerns.  [name] also had  ultrasound today. Previous ultrasound on 04/11/17 visualized microcephaly.  Ultrasound today visualized head circumference less than 3%tile, but the measurements did not meet clinical criteria for microcephaly on today's ultrasound. See separate ultrasound reports and previous consult note from Dr. Margurite Auerbach.  The family histories were reviewed otherwise found to be contributory for a maternal half-brother to Mr. Trombetta with congenital hole in the heart, not requiring surgical correction.  We reviewed that congenital heart defects (CHDs) can be isolated or a feature of an underlying genetic condition. Congenital heart defects are most often multifactorial in etiology, but can also result from chromosome aberrations, single gene conditions, or teratogenic exposures. We discussed that isolated, nonsyndromic CHDs occur in approximately 1% of the general population. If Mr. Chancellor relative had an isolated CHD, the risk of recurrence is not expected to be increased for Mr. Nicklaus offspring (a third degree relative to the affected individual). If however, his relative had an underlying genetic condition that caused the CHD, the risk of recurrence could be increased. In this case, the specific chance of recurrence depends on the inheritance of the condition. Without further information, an accurate risk assessment cannot be provided. We discussed the availability of a detailed anatomy ultrasound to assess the development of the heart during the pregnancy. Without further information regarding the provided family history, an accurate genetic risk cannot be calculated. Further genetic counseling is warranted if more information is obtained.  Mrs. Shiya Fogelman denied exposure to environmental toxins or chemical agents. She denied the use of alcohol, tobacco or street drugs. She denied significant viral illnesses during the course of her pregnancy. Her medical and surgical histories were contributory for hypertension, for which she  is treated with labetalol.   I counseled  this couple regarding the above risks and available options. Most of the counseling was provided by Lady Deutscher, [employer] genetic counseling student, under my direct supervision. The approximate face-to-face time with the genetic counselor was 40 minutes.  Chipper Oman, MS Certified Genetic Counselor 05/09/2017

## 2017-05-13 DIAGNOSIS — Z3A30 30 weeks gestation of pregnancy: Secondary | ICD-10-CM | POA: Diagnosis not present

## 2017-05-13 DIAGNOSIS — O3433 Maternal care for cervical incompetence, third trimester: Secondary | ICD-10-CM | POA: Diagnosis not present

## 2017-05-27 DIAGNOSIS — L299 Pruritus, unspecified: Secondary | ICD-10-CM | POA: Diagnosis not present

## 2017-05-30 DIAGNOSIS — Z3A33 33 weeks gestation of pregnancy: Secondary | ICD-10-CM | POA: Diagnosis not present

## 2017-05-30 DIAGNOSIS — O10919 Unspecified pre-existing hypertension complicating pregnancy, unspecified trimester: Secondary | ICD-10-CM | POA: Diagnosis not present

## 2017-05-30 DIAGNOSIS — O09529 Supervision of elderly multigravida, unspecified trimester: Secondary | ICD-10-CM | POA: Diagnosis not present

## 2017-06-04 ENCOUNTER — Inpatient Hospital Stay (HOSPITAL_COMMUNITY)
Admission: AD | Admit: 2017-06-04 | Payer: BLUE CROSS/BLUE SHIELD | Source: Ambulatory Visit | Admitting: Obstetrics & Gynecology

## 2017-06-06 ENCOUNTER — Other Ambulatory Visit (HOSPITAL_COMMUNITY): Payer: Self-pay | Admitting: *Deleted

## 2017-06-06 ENCOUNTER — Other Ambulatory Visit (HOSPITAL_COMMUNITY): Payer: Self-pay | Admitting: Obstetrics and Gynecology

## 2017-06-06 ENCOUNTER — Ambulatory Visit (HOSPITAL_COMMUNITY)
Admission: RE | Admit: 2017-06-06 | Discharge: 2017-06-06 | Disposition: A | Discharge status: Home / Self Care | Payer: BLUE CROSS/BLUE SHIELD | Source: Ambulatory Visit | Attending: Obstetrics & Gynecology | Admitting: Obstetrics & Gynecology

## 2017-06-06 ENCOUNTER — Encounter (HOSPITAL_COMMUNITY): Payer: Self-pay

## 2017-06-06 DIAGNOSIS — O3433 Maternal care for cervical incompetence, third trimester: Secondary | ICD-10-CM

## 2017-06-06 DIAGNOSIS — O365990 Maternal care for other known or suspected poor fetal growth, unspecified trimester, not applicable or unspecified: Secondary | ICD-10-CM

## 2017-06-06 DIAGNOSIS — O10013 Pre-existing essential hypertension complicating pregnancy, third trimester: Secondary | ICD-10-CM | POA: Insufficient documentation

## 2017-06-06 DIAGNOSIS — O10919 Unspecified pre-existing hypertension complicating pregnancy, unspecified trimester: Secondary | ICD-10-CM

## 2017-06-06 DIAGNOSIS — O99210 Obesity complicating pregnancy, unspecified trimester: Secondary | ICD-10-CM

## 2017-06-06 DIAGNOSIS — O09513 Supervision of elderly primigravida, third trimester: Secondary | ICD-10-CM

## 2017-06-06 DIAGNOSIS — Z3A34 34 weeks gestation of pregnancy: Secondary | ICD-10-CM | POA: Diagnosis not present

## 2017-06-06 DIAGNOSIS — O99213 Obesity complicating pregnancy, third trimester: Secondary | ICD-10-CM | POA: Insufficient documentation

## 2017-06-06 DIAGNOSIS — Q02 Microcephaly: Secondary | ICD-10-CM

## 2017-06-06 DIAGNOSIS — O365930 Maternal care for other known or suspected poor fetal growth, third trimester, not applicable or unspecified: Secondary | ICD-10-CM | POA: Insufficient documentation

## 2017-06-06 DIAGNOSIS — IMO0002 Reserved for concepts with insufficient information to code with codable children: Secondary | ICD-10-CM

## 2017-06-10 DIAGNOSIS — Z3A34 34 weeks gestation of pregnancy: Secondary | ICD-10-CM | POA: Diagnosis not present

## 2017-06-10 DIAGNOSIS — Z23 Encounter for immunization: Secondary | ICD-10-CM | POA: Diagnosis not present

## 2017-06-10 DIAGNOSIS — O10919 Unspecified pre-existing hypertension complicating pregnancy, unspecified trimester: Secondary | ICD-10-CM | POA: Diagnosis not present

## 2017-06-13 ENCOUNTER — Other Ambulatory Visit (HOSPITAL_COMMUNITY): Payer: Self-pay | Admitting: *Deleted

## 2017-06-13 ENCOUNTER — Encounter (HOSPITAL_COMMUNITY): Payer: Self-pay

## 2017-06-13 ENCOUNTER — Ambulatory Visit (HOSPITAL_COMMUNITY)
Admission: RE | Admit: 2017-06-13 | Discharge: 2017-06-13 | Disposition: A | Discharge status: Home / Self Care | Payer: BLUE CROSS/BLUE SHIELD | Source: Ambulatory Visit | Attending: Obstetrics & Gynecology | Admitting: Obstetrics & Gynecology

## 2017-06-13 DIAGNOSIS — O10013 Pre-existing essential hypertension complicating pregnancy, third trimester: Secondary | ICD-10-CM | POA: Diagnosis not present

## 2017-06-13 DIAGNOSIS — Z3A35 35 weeks gestation of pregnancy: Secondary | ICD-10-CM | POA: Insufficient documentation

## 2017-06-13 DIAGNOSIS — O99213 Obesity complicating pregnancy, third trimester: Secondary | ICD-10-CM | POA: Diagnosis not present

## 2017-06-13 DIAGNOSIS — O09513 Supervision of elderly primigravida, third trimester: Secondary | ICD-10-CM | POA: Insufficient documentation

## 2017-06-13 DIAGNOSIS — O3433 Maternal care for cervical incompetence, third trimester: Secondary | ICD-10-CM | POA: Diagnosis not present

## 2017-06-13 DIAGNOSIS — IMO0002 Reserved for concepts with insufficient information to code with codable children: Secondary | ICD-10-CM

## 2017-06-13 DIAGNOSIS — O365930 Maternal care for other known or suspected poor fetal growth, third trimester, not applicable or unspecified: Secondary | ICD-10-CM | POA: Diagnosis not present

## 2017-06-17 DIAGNOSIS — O133 Gestational [pregnancy-induced] hypertension without significant proteinuria, third trimester: Secondary | ICD-10-CM | POA: Diagnosis not present

## 2017-06-17 DIAGNOSIS — Z3A35 35 weeks gestation of pregnancy: Secondary | ICD-10-CM | POA: Diagnosis not present

## 2017-06-18 ENCOUNTER — Encounter (HOSPITAL_COMMUNITY): Payer: Self-pay

## 2017-06-20 ENCOUNTER — Ambulatory Visit (HOSPITAL_COMMUNITY)
Admission: RE | Admit: 2017-06-20 | Discharge: 2017-06-20 | Disposition: A | Discharge status: Home / Self Care | Payer: BLUE CROSS/BLUE SHIELD | Source: Ambulatory Visit | Attending: Obstetrics & Gynecology | Admitting: Obstetrics & Gynecology

## 2017-06-20 ENCOUNTER — Encounter (HOSPITAL_COMMUNITY): Payer: Self-pay

## 2017-06-20 ENCOUNTER — Other Ambulatory Visit (HOSPITAL_COMMUNITY): Payer: Self-pay | Admitting: Obstetrics and Gynecology

## 2017-06-20 DIAGNOSIS — O10913 Unspecified pre-existing hypertension complicating pregnancy, third trimester: Secondary | ICD-10-CM | POA: Insufficient documentation

## 2017-06-20 DIAGNOSIS — O99213 Obesity complicating pregnancy, third trimester: Secondary | ICD-10-CM | POA: Diagnosis not present

## 2017-06-20 DIAGNOSIS — Z3A36 36 weeks gestation of pregnancy: Secondary | ICD-10-CM | POA: Insufficient documentation

## 2017-06-20 DIAGNOSIS — O365990 Maternal care for other known or suspected poor fetal growth, unspecified trimester, not applicable or unspecified: Secondary | ICD-10-CM

## 2017-06-20 DIAGNOSIS — Z6841 Body Mass Index (BMI) 40.0 and over, adult: Secondary | ICD-10-CM | POA: Insufficient documentation

## 2017-06-20 DIAGNOSIS — O10013 Pre-existing essential hypertension complicating pregnancy, third trimester: Secondary | ICD-10-CM

## 2017-06-20 DIAGNOSIS — Z7982 Long term (current) use of aspirin: Secondary | ICD-10-CM | POA: Insufficient documentation

## 2017-06-20 DIAGNOSIS — O09513 Supervision of elderly primigravida, third trimester: Secondary | ICD-10-CM | POA: Insufficient documentation

## 2017-06-20 DIAGNOSIS — Z79899 Other long term (current) drug therapy: Secondary | ICD-10-CM | POA: Diagnosis not present

## 2017-06-20 DIAGNOSIS — O3433 Maternal care for cervical incompetence, third trimester: Secondary | ICD-10-CM

## 2017-06-20 DIAGNOSIS — O365930 Maternal care for other known or suspected poor fetal growth, third trimester, not applicable or unspecified: Secondary | ICD-10-CM | POA: Diagnosis not present

## 2017-06-20 DIAGNOSIS — IMO0002 Reserved for concepts with insufficient information to code with codable children: Secondary | ICD-10-CM

## 2017-06-20 DIAGNOSIS — E669 Obesity, unspecified: Secondary | ICD-10-CM | POA: Insufficient documentation

## 2017-06-20 HISTORY — DX: Essential (primary) hypertension: I10

## 2017-06-24 DIAGNOSIS — O133 Gestational [pregnancy-induced] hypertension without significant proteinuria, third trimester: Secondary | ICD-10-CM | POA: Diagnosis not present

## 2017-06-24 DIAGNOSIS — Z3A36 36 weeks gestation of pregnancy: Secondary | ICD-10-CM | POA: Diagnosis not present

## 2017-06-25 ENCOUNTER — Encounter (HOSPITAL_COMMUNITY): Payer: Self-pay | Admitting: *Deleted

## 2017-06-25 ENCOUNTER — Telehealth (HOSPITAL_COMMUNITY): Payer: Self-pay | Admitting: *Deleted

## 2017-06-25 ENCOUNTER — Other Ambulatory Visit: Payer: Self-pay | Admitting: Obstetrics & Gynecology

## 2017-06-25 NOTE — Telephone Encounter (Signed)
Preadmission screen  

## 2017-06-27 ENCOUNTER — Ambulatory Visit (HOSPITAL_COMMUNITY)
Admission: RE | Admit: 2017-06-27 | Discharge: 2017-06-27 | Disposition: A | Discharge status: Home / Self Care | Payer: BLUE CROSS/BLUE SHIELD | Source: Ambulatory Visit | Attending: Obstetrics & Gynecology | Admitting: Obstetrics & Gynecology

## 2017-06-27 ENCOUNTER — Encounter (HOSPITAL_COMMUNITY): Payer: Self-pay

## 2017-06-27 DIAGNOSIS — O365930 Maternal care for other known or suspected poor fetal growth, third trimester, not applicable or unspecified: Secondary | ICD-10-CM | POA: Diagnosis not present

## 2017-06-27 DIAGNOSIS — O10019 Pre-existing essential hypertension complicating pregnancy, unspecified trimester: Secondary | ICD-10-CM | POA: Diagnosis not present

## 2017-06-27 DIAGNOSIS — O3433 Maternal care for cervical incompetence, third trimester: Secondary | ICD-10-CM | POA: Insufficient documentation

## 2017-06-27 DIAGNOSIS — O09513 Supervision of elderly primigravida, third trimester: Secondary | ICD-10-CM | POA: Diagnosis not present

## 2017-06-27 DIAGNOSIS — Z3A37 37 weeks gestation of pregnancy: Secondary | ICD-10-CM | POA: Diagnosis not present

## 2017-06-27 DIAGNOSIS — O99213 Obesity complicating pregnancy, third trimester: Secondary | ICD-10-CM | POA: Diagnosis not present

## 2017-06-27 DIAGNOSIS — IMO0002 Reserved for concepts with insufficient information to code with codable children: Secondary | ICD-10-CM

## 2017-06-27 DIAGNOSIS — O365130 Maternal care for known or suspected placental insufficiency, third trimester, not applicable or unspecified: Secondary | ICD-10-CM | POA: Diagnosis not present

## 2017-06-27 DIAGNOSIS — O10013 Pre-existing essential hypertension complicating pregnancy, third trimester: Secondary | ICD-10-CM | POA: Diagnosis not present

## 2017-06-27 DIAGNOSIS — Z148 Genetic carrier of other disease: Secondary | ICD-10-CM

## 2017-07-01 DIAGNOSIS — O3433 Maternal care for cervical incompetence, third trimester: Secondary | ICD-10-CM | POA: Diagnosis not present

## 2017-07-01 DIAGNOSIS — Z3A37 37 weeks gestation of pregnancy: Secondary | ICD-10-CM | POA: Diagnosis not present

## 2017-07-01 DIAGNOSIS — O133 Gestational [pregnancy-induced] hypertension without significant proteinuria, third trimester: Secondary | ICD-10-CM | POA: Diagnosis not present

## 2017-07-03 NOTE — H&P (Signed)
Janet Deleon is a 36 y.o. female presenting for labor IOL at 89 wks for [facility] and poor fetal growth.  Obesity - with no maternal weight gain in preg. PCOS- stoped Metformin 500mg  at night at 25 wks. Femara conception. No GDM.  Emergency cerclage at 21 wks for funneled cervix. Bedrest, vag Prometrium 21-36 wks. Cerclage removed 3 days back in office.  CHTN, well controlled, Labetalol 100mg  bid. bbASA SGA infant, growth poor since 25 wks, MFM referral for microcephaly and small AC, pt saw MFM q 4 wks for growth sono and started wkly for ANtesting testing from 33 wks in office with NST and BPP/ Dopplers with MFM. Last growth 10/12 5'5" 22% and AC 21 %, AFI nl. Vx (this was the most improved growth)   OB History    Gravida Para Term Preterm AB Living   1             SAB TAB Ectopic Multiple Live Births                 Past Medical History:  Diagnosis Date  . Anemia   . Hypertension    Past Surgical History:  Procedure Laterality Date  . CERVICAL CERCLAGE N/A 03/10/2017   Procedure: Emergency CERVICAL CERCLAGE, Excision Vaginal Septum;  Surgeon: Azucena Fallen, MD;  Location: [facility];  Service: Gynecology;  Laterality: N/A;  EDD: 07/17/17 Allergy: Sulfa, Demerol  . lt eye surgery    . OVARIAN CYST REMOVAL     1996  . TONSILLECTOMY     Family History: family history includes Diabetes in her father; Heart attack in her father; Hypertension in her father; Liver cancer in her paternal grandmother; Lung cancer in her paternal grandmother. Social History:  reports that she has quit smoking. She has never used smokeless tobacco. She reports that she does not drink alcohol or use drugs.     Maternal Diabetes: No Genetic Screening: Normal Maternal Ultrasounds/Referrals: Abnormal:  Findings:   IUGR microcephaly since 25 wks but improved overall  Fetal Ultrasounds or other Referrals:  Referred to Materal Fetal Medicine  Maternal Substance Abuse:  No Significant Maternal Medications:  Prometrium  vaginal from 21 to 37 wks. Stoped Metformin at 25 wks,  Significant Maternal Lab Results:  Lab values include: Group B Strep positive Other Comments:  None  ROS neg CP/ SOB/ LE swelling History   Last menstrual period 10/10/2016. Exam Physical Exam  BP 130/88   Pulse 83   Temp 98 F (36.7 C) (Oral)   Resp 16   Ht 5\' 2"  (1.575 m)   Wt 258 lb 9.6 oz (117.3 kg)   LMP 10/10/2016   BMI 47.30 kg/m   Physical exam:  A&O x 3, no acute distress. Pleasant HEENT neg, no thyromegaly Lungs CTA bilat CV RRR, S1S2 normal Abdo soft, non tender, non acute Extr no edema/ tenderness Pelvic Cx f.tip/ soft/ long/ stn -3/ Vtx (last office sono, exam 37.4 wks) FHT  120s/ + acels/ no decels/ mod variab- cat I Toco none  Prenatal labs: ABO, Rh: --/--/O POS (07/02 1130) Antibody: NEG (07/02 1130) Rubella: Immune (04/12 0000) RPR: Nonreactive (04/12 0000)  HBsAg: Negative (04/12 0000)  HIV: Non-reactive (04/12 0000)  GBS: Positive (04/12 0000)  Glucola normal   Assessment/Plan: 36 yo G1, 38 wks. IOL for SGA and CHTN per MFM recc.  Cytotec, watch closely. Then pitocin if needed GBS(+) start PCN Epidural ok EFW 5.1/2 lbs. Anticipate SVD   Janet Deleon R

## 2017-07-04 ENCOUNTER — Inpatient Hospital Stay (HOSPITAL_COMMUNITY): Admission: RE | Admit: 2017-07-04 | Payer: BLUE CROSS/BLUE SHIELD | Source: Ambulatory Visit

## 2017-07-04 ENCOUNTER — Inpatient Hospital Stay (HOSPITAL_COMMUNITY)
Admission: RE | Admit: 2017-07-04 | Discharge: 2017-07-06 | DRG: 807 | Disposition: A | Discharge status: Home / Self Care | Payer: BLUE CROSS/BLUE SHIELD | Source: Ambulatory Visit | Attending: Obstetrics & Gynecology | Admitting: Obstetrics & Gynecology

## 2017-07-04 ENCOUNTER — Ambulatory Visit (HOSPITAL_COMMUNITY): Payer: BLUE CROSS/BLUE SHIELD

## 2017-07-04 ENCOUNTER — Encounter (HOSPITAL_COMMUNITY): Payer: Self-pay

## 2017-07-04 DIAGNOSIS — Z87891 Personal history of nicotine dependence: Secondary | ICD-10-CM

## 2017-07-04 DIAGNOSIS — Z3A38 38 weeks gestation of pregnancy: Secondary | ICD-10-CM

## 2017-07-04 DIAGNOSIS — O99824 Streptococcus B carrier state complicating childbirth: Secondary | ICD-10-CM | POA: Diagnosis not present

## 2017-07-04 DIAGNOSIS — O99214 Obesity complicating childbirth: Secondary | ICD-10-CM | POA: Diagnosis not present

## 2017-07-04 DIAGNOSIS — O3433 Maternal care for cervical incompetence, third trimester: Secondary | ICD-10-CM | POA: Diagnosis not present

## 2017-07-04 DIAGNOSIS — O358XX0 Maternal care for other (suspected) fetal abnormality and damage, not applicable or unspecified: Secondary | ICD-10-CM | POA: Diagnosis present

## 2017-07-04 DIAGNOSIS — O1002 Pre-existing essential hypertension complicating childbirth: Secondary | ICD-10-CM | POA: Diagnosis not present

## 2017-07-04 DIAGNOSIS — Q02 Microcephaly: Secondary | ICD-10-CM | POA: Diagnosis not present

## 2017-07-04 DIAGNOSIS — Z3490 Encounter for supervision of normal pregnancy, unspecified, unspecified trimester: Secondary | ICD-10-CM | POA: Diagnosis present

## 2017-07-04 DIAGNOSIS — Z23 Encounter for immunization: Secondary | ICD-10-CM | POA: Diagnosis not present

## 2017-07-04 DIAGNOSIS — O365930 Maternal care for other known or suspected poor fetal growth, third trimester, not applicable or unspecified: Secondary | ICD-10-CM | POA: Diagnosis not present

## 2017-07-04 DIAGNOSIS — O1092 Unspecified pre-existing hypertension complicating childbirth: Secondary | ICD-10-CM | POA: Diagnosis not present

## 2017-07-04 DIAGNOSIS — I10 Essential (primary) hypertension: Secondary | ICD-10-CM | POA: Diagnosis present

## 2017-07-04 HISTORY — DX: Polycystic ovarian syndrome: E28.2

## 2017-07-04 LAB — COMPREHENSIVE METABOLIC PANEL
ALT: 15 U/L (ref 14–54)
AST: 17 U/L (ref 15–41)
Albumin: 3.1 g/dL — ABNORMAL LOW (ref 3.5–5.0)
Alkaline Phosphatase: 118 U/L (ref 38–126)
Anion gap: 10 (ref 5–15)
BUN: 7 mg/dL (ref 6–20)
CALCIUM: 9.4 mg/dL (ref 8.9–10.3)
CHLORIDE: 101 mmol/L (ref 101–111)
CO2: 21 mmol/L — ABNORMAL LOW (ref 22–32)
CREATININE: 0.61 mg/dL (ref 0.44–1.00)
GFR calc Af Amer: >60 mL/min (ref >60)
GFR calc non Af Amer: >60 mL/min (ref >60)
Glucose, Bld: 108 mg/dL — ABNORMAL HIGH (ref 65–99)
Potassium: 3.8 mmol/L (ref 3.5–5.1)
SODIUM: 132 mmol/L — AB (ref 135–145)
TOTAL PROTEIN: 6.2 g/dL — AB (ref 6.5–8.1)
Total Bilirubin: 0.3 mg/dL (ref 0.3–1.2)

## 2017-07-04 LAB — CBC
HCT: 35.5 % — ABNORMAL LOW (ref 36.0–46.0)
Hemoglobin: 12.1 g/dL (ref 12.0–15.0)
MCH: 30.2 pg (ref 26.0–34.0)
MCHC: 34.1 g/dL (ref 30.0–36.0)
MCV: 88.5 fL (ref 78.0–100.0)
PLATELETS: 210 10*3/uL (ref 150–400)
RBC: 4.01 MIL/uL (ref 3.87–5.11)
RDW: 14.7 % (ref 11.5–15.5)
WBC: 11.6 10*3/uL — AB (ref 4.0–10.5)

## 2017-07-04 LAB — RPR: RPR: NONREACTIVE

## 2017-07-04 LAB — TYPE AND SCREEN
ABO/RH(D): O POS
Antibody Screen: NEGATIVE

## 2017-07-04 MED ORDER — EPHEDRINE 5 MG/ML INJ
10.0000 mg | INTRAVENOUS | Status: DC | PRN
  Filled 2017-07-04: qty 2

## 2017-07-04 MED ORDER — FENTANYL CITRATE (PF) 100 MCG/2ML IJ SOLN
100.0000 ug | INTRAMUSCULAR | Status: DC | PRN
  Administered 2017-07-04 (×2): 100 ug via INTRAVENOUS
  Filled 2017-07-04 (×2): qty 2

## 2017-07-04 MED ORDER — ACETAMINOPHEN 325 MG PO TABS: 650.0000 mg | ORAL_TABLET | ORAL | Status: DC | PRN

## 2017-07-04 MED ORDER — COCONUT OIL OIL: 1.0000 "application " | TOPICAL_OIL | Status: DC | PRN

## 2017-07-04 MED ORDER — SOD CITRATE-CITRIC ACID 500-334 MG/5ML PO SOLN: 30.0000 mL | ORAL | Status: DC | PRN

## 2017-07-04 MED ORDER — OXYTOCIN 40 UNITS IN LACTATED RINGERS INFUSION - SIMPLE MED
1.0000 m[IU]/min | INTRAVENOUS | Status: DC
  Administered 2017-07-04: 40 m[IU]/min via INTRAVENOUS
  Filled 2017-07-04: qty 1000

## 2017-07-04 MED ORDER — ONDANSETRON HCL 4 MG/2ML IJ SOLN: 4.0000 mg | INTRAMUSCULAR | Status: DC | PRN

## 2017-07-04 MED ORDER — WITCH HAZEL-GLYCERIN EX PADS: 1.0000 "application " | MEDICATED_PAD | CUTANEOUS | Status: DC | PRN

## 2017-07-04 MED ORDER — PHENYLEPHRINE 40 MCG/ML (10ML) SYRINGE FOR IV PUSH (FOR BLOOD PRESSURE SUPPORT)
80.0000 ug | PREFILLED_SYRINGE | INTRAVENOUS | Status: DC | PRN
  Filled 2017-07-04: qty 5

## 2017-07-04 MED ORDER — BENZOCAINE-MENTHOL 20-0.5 % EX AERO
1.0000 "application " | INHALATION_SPRAY | CUTANEOUS | Status: DC | PRN
  Administered 2017-07-04: 1 via TOPICAL
  Filled 2017-07-04: qty 56

## 2017-07-04 MED ORDER — LACTATED RINGERS IV SOLN: 500.0000 mL | Freq: Once | INTRAVENOUS | Status: DC

## 2017-07-04 MED ORDER — SENNOSIDES-DOCUSATE SODIUM 8.6-50 MG PO TABS
2.0000 | ORAL_TABLET | ORAL | Status: DC
  Administered 2017-07-05 (×2): 2 via ORAL
  Filled 2017-07-04 (×2): qty 2

## 2017-07-04 MED ORDER — ZOLPIDEM TARTRATE 5 MG PO TABS: 5.0000 mg | ORAL_TABLET | Freq: Every evening | ORAL | Status: DC | PRN

## 2017-07-04 MED ORDER — DIBUCAINE 1 % RE OINT: 1.0000 "application " | TOPICAL_OINTMENT | RECTAL | Status: DC | PRN

## 2017-07-04 MED ORDER — LABETALOL HCL 100 MG PO TABS
100.0000 mg | ORAL_TABLET | Freq: Two times a day (BID) | ORAL | Status: DC
  Administered 2017-07-04: 100 mg via ORAL
  Filled 2017-07-04: qty 1

## 2017-07-04 MED ORDER — LABETALOL HCL 100 MG PO TABS
100.0000 mg | ORAL_TABLET | Freq: Two times a day (BID) | ORAL | Status: DC
  Administered 2017-07-04 – 2017-07-06 (×4): 100 mg via ORAL
  Filled 2017-07-04 (×4): qty 1

## 2017-07-04 MED ORDER — PRENATAL MULTIVITAMIN CH
1.0000 | ORAL_TABLET | Freq: Every day | ORAL | Status: DC
  Administered 2017-07-05 – 2017-07-06 (×2): 1 via ORAL
  Filled 2017-07-04 (×2): qty 1

## 2017-07-04 MED ORDER — LACTATED RINGERS IV SOLN
INTRAVENOUS | Status: DC
  Administered 2017-07-04 (×2): via INTRAVENOUS

## 2017-07-04 MED ORDER — TERBUTALINE SULFATE 1 MG/ML IJ SOLN
0.2500 mg | Freq: Once | INTRAMUSCULAR | Status: DC | PRN
  Filled 2017-07-04: qty 1

## 2017-07-04 MED ORDER — IBUPROFEN 600 MG PO TABS
600.0000 mg | ORAL_TABLET | Freq: Four times a day (QID) | ORAL | Status: DC
  Administered 2017-07-04 – 2017-07-06 (×9): 600 mg via ORAL
  Filled 2017-07-04 (×9): qty 1

## 2017-07-04 MED ORDER — TETANUS-DIPHTH-ACELL PERTUSSIS 5-2.5-18.5 LF-MCG/0.5 IM SUSP: 0.5000 mL | Freq: Once | INTRAMUSCULAR | Status: DC

## 2017-07-04 MED ORDER — ONDANSETRON HCL 4 MG/2ML IJ SOLN
4.0000 mg | Freq: Four times a day (QID) | INTRAMUSCULAR | Status: DC | PRN
  Administered 2017-07-04: 4 mg via INTRAVENOUS
  Filled 2017-07-04: qty 2

## 2017-07-04 MED ORDER — DIPHENHYDRAMINE HCL 25 MG PO CAPS: 25.0000 mg | ORAL_CAPSULE | Freq: Four times a day (QID) | ORAL | Status: DC | PRN

## 2017-07-04 MED ORDER — OXYCODONE HCL 5 MG PO TABS: 10.0000 mg | ORAL_TABLET | ORAL | Status: DC | PRN

## 2017-07-04 MED ORDER — SIMETHICONE 80 MG PO CHEW: 80.0000 mg | CHEWABLE_TABLET | ORAL | Status: DC | PRN

## 2017-07-04 MED ORDER — LACTATED RINGERS IV SOLN: 500.0000 mL | INTRAVENOUS | Status: DC | PRN

## 2017-07-04 MED ORDER — LIDOCAINE HCL (PF) 1 % IJ SOLN
INTRAMUSCULAR | Status: AC
  Filled 2017-07-04: qty 30

## 2017-07-04 MED ORDER — FENTANYL 2.5 MCG/ML BUPIVACAINE 1/10 % EPIDURAL INFUSION (WH - ANES): 14.0000 mL/h | INTRAMUSCULAR | Status: DC | PRN

## 2017-07-04 MED ORDER — PENICILLIN G POTASSIUM 5000000 UNITS IJ SOLR
5.0000 10*6.[IU] | Freq: Once | INTRAVENOUS | Status: AC
  Administered 2017-07-04: 5 10*6.[IU] via INTRAVENOUS
  Filled 2017-07-04: qty 5

## 2017-07-04 MED ORDER — OXYCODONE HCL 5 MG PO TABS: 5.0000 mg | ORAL_TABLET | ORAL | Status: DC | PRN

## 2017-07-04 MED ORDER — PENICILLIN G POT IN DEXTROSE 60000 UNIT/ML IV SOLN
3.0000 10*6.[IU] | INTRAVENOUS | Status: DC
  Administered 2017-07-04 (×3): 3 10*6.[IU] via INTRAVENOUS
  Filled 2017-07-04 (×6): qty 50

## 2017-07-04 MED ORDER — ONDANSETRON HCL 4 MG PO TABS: 4.0000 mg | ORAL_TABLET | ORAL | Status: DC | PRN

## 2017-07-04 MED ORDER — MISOPROSTOL 25 MCG QUARTER TABLET
25.0000 ug | ORAL_TABLET | ORAL | Status: DC
  Administered 2017-07-04 (×2): 25 ug via VAGINAL
  Filled 2017-07-04 (×8): qty 1

## 2017-07-04 MED ORDER — DIPHENHYDRAMINE HCL 50 MG/ML IJ SOLN: 12.5000 mg | INTRAMUSCULAR | Status: DC | PRN

## 2017-07-04 NOTE — Progress Notes (Signed)
S: Doing well, no complaints, pain well controlled and feeling contractions off cytotec.  O: BP 117/69   Pulse 83   Temp 98.5 F (36.9 C) (Oral)   Resp 18   Ht 5\' 2"  (1.575 m)   Wt 117.3 kg (258 lb 9.6 oz)   LMP 10/10/2016   BMI 47.30 kg/m    FHT:  FHR: 135s bpm, variability: moderate,  accelerations:  Present,  decelerations:  Absent UC:   irritabiltiu SVE:   Dilation: 1.5 Effacement (%): 60 Station: -3 Exam by:: e. poore, rnc  cvx 3/80%/ vtx -3. AROM clear  CBC    Component Value Date/Time   WBC 11.6 (H) 07/04/2017 0121   RBC 4.01 07/04/2017 0121   HGB 12.1 07/04/2017 0121   HCT 35.5 (L) 07/04/2017 0121   PLT 210 07/04/2017 0121   MCV 88.5 07/04/2017 0121   MCH 30.2 07/04/2017 0121   MCHC 34.1 07/04/2017 0121   RDW 14.7 07/04/2017 0121   LYMPHSABS 2.4 12/08/2016 1507   MONOABS 0.5 12/08/2016 1507   EOSABS 0.1 12/08/2016 1507   BASOSABS 0.1 12/08/2016 1507    CMP     Component Value Date/Time   NA 132 (L) 07/04/2017 0121   K 3.8 07/04/2017 0121   CL 101 07/04/2017 0121   CO2 21 (L) 07/04/2017 0121   GLUCOSE 108 (H) 07/04/2017 0121   BUN 7 07/04/2017 0121   CREATININE 0.61 07/04/2017 0121   CALCIUM 9.4 07/04/2017 0121   PROT 6.2 (L) 07/04/2017 0121   ALBUMIN 3.1 (L) 07/04/2017 0121   AST 17 07/04/2017 0121   ALT 15 07/04/2017 0121   ALKPHOS 118 07/04/2017 0121   BILITOT 0.3 07/04/2017 0121   GFRNONAA >60 07/04/2017 0121   GFRAA >60 07/04/2017 0121     A / P:  36 y.o.  Obstetric History   G1   P0   T0   P0   A0   L0    SAB0   TAB0   Ectopic0   Multiple0   Live Births0    at [redacted]w[redacted]d IOL, chronic htn, stable labs/ bp, no sx. IOL with cytotec, now s/p AROM and plan pitocin at 10am, h./o cervical nicompetence, cerclage removed last wk  Fetal Wellbeing:  Category I Pain Control:  Labor support without medications  Anticipated MOD:  NSVD  Janet Deleon A. 07/04/2017, 9:10 AM

## 2017-07-04 NOTE — Anesthesia Pain Management Evaluation Note (Signed)
  CRNA Pain Management Visit Note  Patient: Janet Deleon, 36 y.o., female  "Hello I am a member of the anesthesia team at [facility]. We have an anesthesia team available at all times to provide care throughout the hospital, including epidural management and anesthesia for C-section. I don't know your plan for the delivery whether it a natural birth, water birth, IV sedation, nitrous supplementation, doula or epidural, but we want to meet your pain goals."   1.Was your pain managed to your expectations on prior hospitalizations?   No prior hospitalizations  2.What is your expectation for pain management during this hospitalization?     Labor support without medications  3.How can we help you reach that goal? Patient would like natural childbirth without interventions discussed options  Record the patient's initial score and the patient's pain goal.   Pain: 2  Pain Goal: 10 [facility] wants you to be able to say your pain was always managed very well.  Rayvon Char 07/04/2017

## 2017-07-04 NOTE — Progress Notes (Addendum)
Patient ID: Janet Deleon, female   DOB: 1981/06/14, 36 y.o.   MRN: [id number] Subjective: Doing well,  Objective: BP 136/62   Pulse 82   Temp 98 F (36.7 C) (Oral)   Resp 16   Ht 5\' 2"  (1.575 m)   Wt 258 lb 9.6 oz (117.3 kg)   LMP 10/10/2016   BMI 47.30 kg/m   FHT:  FHR: 120 bpm, variability: moderate,  accelerations:  Present,  decelerations:  Absent UC:   irregular, random SVE:   Dilation: 1.5 Effacement (%): 60 Station: -3 Exam by:: e. poore, rnc  Assessment / Plan: IOL for SGA and CHTN. Doing well. S/p Cytotec at around 2 am and 6 am GBS(+), PCN. S/p cerclage removal at 37.4 wks.  CHTN- stable BPs/   Fetal Wellbeing:  Category I Pain Control:  Labor support without medications  Anticipated MOD:  NSVD  Janet Deleon 07/04/2017, 6:10 AM

## 2017-07-05 NOTE — Progress Notes (Signed)
PPD # 1 SVD Information for the patient's newborn:  Janet Deleon, Hocutt [[id number]]  female      bottel feeding formula, discussed breastfeeding and encouraged Baby name: Janet Deleon  S:  Reports feeling well.             Tolerating po/ No nausea or vomiting             Bleeding is decreased             Pain controlled with ibuprofen (OTC)             Up ad lib / ambulatory / voiding without difficulties        O:  A & O x 3, in no apparent distress              VS:  Vitals:   07/04/17 1645 07/04/17 1745 07/04/17 2139 07/05/17 0521  BP: 120/66 (!) 144/79 (!) 117/58 102/67  Pulse: 88 83 72 69  Resp: 20 20 18 18   Temp: 98.4 F (36.9 C) 99.1 F (37.3 C) 98.5 F (36.9 C) 98.1 F (36.7 C)  TempSrc: Oral Oral Oral Oral  SpO2:      Weight:      Height:        LABS:  Recent Labs  07/04/17 0121  WBC 11.6*  HGB 12.1  HCT 35.5*  PLT 210    Blood type: --/--/O POS (10/26 0121)  Rubella: Immune (04/12 0000)   I&O: I/O last 3 completed shifts: In: -  Out: 250 [Blood:250]          No intake/output data recorded.   Abdomen: soft, non-tender, non-distended             Fundus: firm, non-tender, U@  Perineum: no edema, repair intact  Lochia: small  Extremities: +1 pedal edema, no calf pain or tenderness    A/P: PPD # 1 36 y.o., G1P1001   Principal Problem:   Postpartum care following vaginal delivery 10/26 Active Problems:   Benign essential hypertension stable on labetalol 100 mg BID   Morbid obesity (HCC)   SVD (spontaneous vaginal delivery)   Doing well - stable status  Routine post partum orders  Anticipate discharge tomorrow    Juliene Pina, MSN, CNM 07/05/2017, 12:18 PM

## 2017-07-06 MED ORDER — BENZOCAINE-MENTHOL 20-0.5 % EX AERO: 1.0000 "application " | INHALATION_SPRAY | CUTANEOUS | Status: DC | PRN

## 2017-07-06 MED ORDER — ACETAMINOPHEN 325 MG PO TABS: 650.0000 mg | ORAL_TABLET | ORAL | Status: DC | PRN

## 2017-07-06 MED ORDER — IBUPROFEN 600 MG PO TABS: 600.0000 mg | ORAL_TABLET | Freq: Four times a day (QID) | ORAL | 0 refills | Status: DC

## 2017-07-06 NOTE — Discharge Summary (Signed)
Obstetric Discharge Summary Reason for Admission: induction of labor and CHTN Prenatal Procedures: cerclage, NST and ultrasound Intrapartum Procedures: spontaneous vaginal delivery Postpartum Procedures: none Complications-Operative and Postpartum: 1st degree perineal laceration Hemoglobin  Date Value Ref Range Status  07/04/2017 12.1 12.0 - 15.0 g/dL Final   HCT  Date Value Ref Range Status  07/04/2017 35.5 (L) 36.0 - 46.0 % Final    Physical Exam:  General: alert, cooperative and no distress Lochia: appropriate Uterine Fundus: firm Incision: healing well DVT Evaluation: No cords or calf tenderness. No significant calf/ankle edema.  Discharge Diagnoses: Term Pregnancy-delivered, chronic hypertension  Discharge Information: Date: 07/06/2017 Activity: pelvic rest Diet: routine Medications:  Allergies as of 07/06/2017      Reactions   Demerol [meperidine] Nausea And Vomiting, Other (See Comments)   Reaction:  High fever   Sulfa Antibiotics Hives      Medication List    STOP taking these medications   progesterone 200 MG capsule Commonly known as:  PROMETRIUM     TAKE these medications   acetaminophen 325 MG tablet Commonly known as:  TYLENOL Take 2 tablets (650 mg total) by mouth every 4 (four) hours as needed (for pain scale < 4).   benzocaine-Menthol 20-0.5 % Aero Commonly known as:  DERMOPLAST Apply 1 application topically as needed for irritation (perineal discomfort).   docusate sodium 100 MG capsule Commonly known as:  COLACE Take 1 capsule (100 mg total) by mouth daily.   ibuprofen 600 MG tablet Commonly known as:  ADVIL,MOTRIN Take 1 tablet (600 mg total) by mouth every 6 (six) hours.   labetalol 100 MG tablet Commonly known as:  NORMODYNE Take 100 mg by mouth 2 (two) times daily.   prenatal multivitamin Tabs tablet Take 1 tablet by mouth at bedtime.            Discharge Care Instructions        Start     Ordered   07/06/17 0000   Discharge wound care:    Comments:  Sitz baths 2 times /day with warm water x 1 week   07/06/17 1050     Condition: stable Instructions: refer to practice specific booklet Discharge to: home Follow-up Information    Azucena Fallen, MD. Schedule an appointment as soon as possible for a visit in 6 week(s).   Specialty:  Obstetrics and Gynecology Contact information: [place] [address] [phone]           Newborn Data: Live born female Everleigh Birth Weight: 5 lb 7.3 oz (2475 g) APGAR: 61, 41  Newborn Delivery   Birth date/time:  07/04/2017 15:06:00 Delivery type:  Vaginal, Spontaneous Delivery      Home with mother.  Juliene Pina ,CNM 07/06/2017, 10:50 AM

## 2017-07-11 ENCOUNTER — Ambulatory Visit (HOSPITAL_COMMUNITY): Payer: BLUE CROSS/BLUE SHIELD

## 2017-07-14 DIAGNOSIS — O135 Gestational [pregnancy-induced] hypertension without significant proteinuria, complicating the puerperium: Secondary | ICD-10-CM | POA: Diagnosis not present

## 2017-07-16 DIAGNOSIS — O135 Gestational [pregnancy-induced] hypertension without significant proteinuria, complicating the puerperium: Secondary | ICD-10-CM | POA: Diagnosis not present

## 2017-08-14 DIAGNOSIS — I10 Essential (primary) hypertension: Secondary | ICD-10-CM | POA: Diagnosis not present

## 2017-12-10 DIAGNOSIS — R5383 Other fatigue: Secondary | ICD-10-CM | POA: Diagnosis not present

## 2017-12-10 DIAGNOSIS — I10 Essential (primary) hypertension: Secondary | ICD-10-CM | POA: Diagnosis not present

## 2017-12-10 DIAGNOSIS — E282 Polycystic ovarian syndrome: Secondary | ICD-10-CM | POA: Diagnosis not present

## 2017-12-10 DIAGNOSIS — E8881 Metabolic syndrome: Secondary | ICD-10-CM | POA: Diagnosis not present

## 2017-12-10 DIAGNOSIS — R6882 Decreased libido: Secondary | ICD-10-CM | POA: Diagnosis not present

## 2017-12-10 DIAGNOSIS — E785 Hyperlipidemia, unspecified: Secondary | ICD-10-CM | POA: Diagnosis not present

## 2017-12-22 DIAGNOSIS — Z01419 Encounter for gynecological examination (general) (routine) without abnormal findings: Secondary | ICD-10-CM | POA: Diagnosis not present

## 2017-12-22 DIAGNOSIS — Z6841 Body Mass Index (BMI) 40.0 and over, adult: Secondary | ICD-10-CM | POA: Diagnosis not present

## 2018-01-26 DIAGNOSIS — E282 Polycystic ovarian syndrome: Secondary | ICD-10-CM | POA: Diagnosis not present

## 2018-01-26 DIAGNOSIS — I10 Essential (primary) hypertension: Secondary | ICD-10-CM | POA: Diagnosis not present

## 2018-01-26 DIAGNOSIS — N924 Excessive bleeding in the premenopausal period: Secondary | ICD-10-CM | POA: Diagnosis not present

## 2018-03-25 DIAGNOSIS — Z3200 Encounter for pregnancy test, result unknown: Secondary | ICD-10-CM | POA: Diagnosis not present

## 2018-03-25 DIAGNOSIS — Z3169 Encounter for other general counseling and advice on procreation: Secondary | ICD-10-CM | POA: Diagnosis not present

## 2018-03-30 DIAGNOSIS — E282 Polycystic ovarian syndrome: Secondary | ICD-10-CM | POA: Diagnosis not present

## 2018-03-30 DIAGNOSIS — E8801 Alpha-1-antitrypsin deficiency: Secondary | ICD-10-CM | POA: Diagnosis not present

## 2018-03-30 DIAGNOSIS — R5383 Other fatigue: Secondary | ICD-10-CM | POA: Diagnosis not present

## 2018-04-22 DIAGNOSIS — E282 Polycystic ovarian syndrome: Secondary | ICD-10-CM | POA: Diagnosis not present

## 2018-05-22 DIAGNOSIS — N979 Female infertility, unspecified: Secondary | ICD-10-CM | POA: Diagnosis not present

## 2018-06-11 DIAGNOSIS — N979 Female infertility, unspecified: Secondary | ICD-10-CM | POA: Diagnosis not present

## 2018-07-12 IMAGING — US US OB COMP LESS 14 WK
1 series · 15 of 28 positions shown · non-contrast
Comparison: None.

CLINICAL DATA: Vaginal bleeding. Eight weeks and 3 days pregnant by
last menstrual period.

EXAM:
OBSTETRIC <14 WK US AND TRANSVAGINAL OB US
TECHNIQUE: Both transabdominal and transvaginal ultrasound examinations were
performed for complete evaluation of the gestation as well as the
maternal uterus, adnexal regions, and pelvic cul-de-sac.
Transvaginal technique was performed to assess early pregnancy.

[Series 1: us ob comp less 14 wk · 15 of 43 slices shown]
[im 1/43]
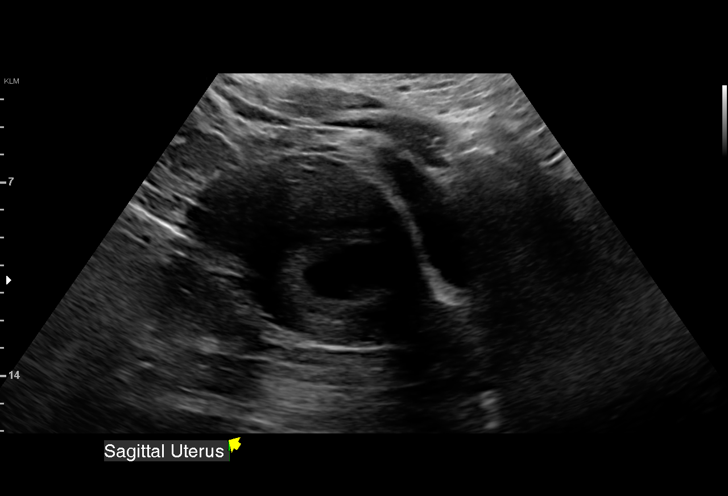
[im 4/43]
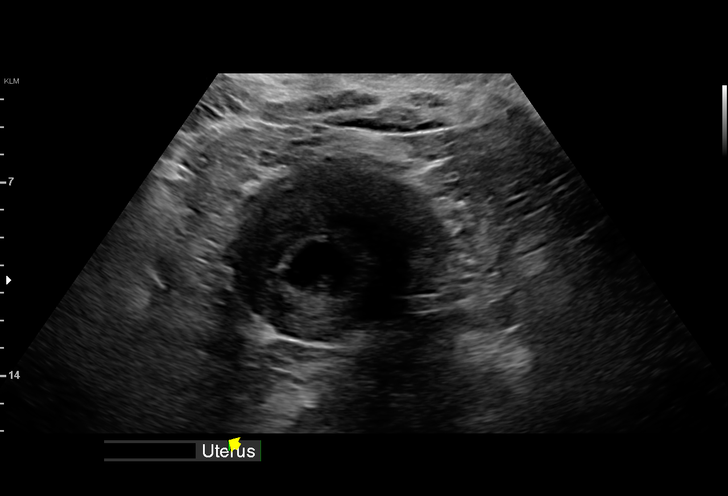
[im 7/43]
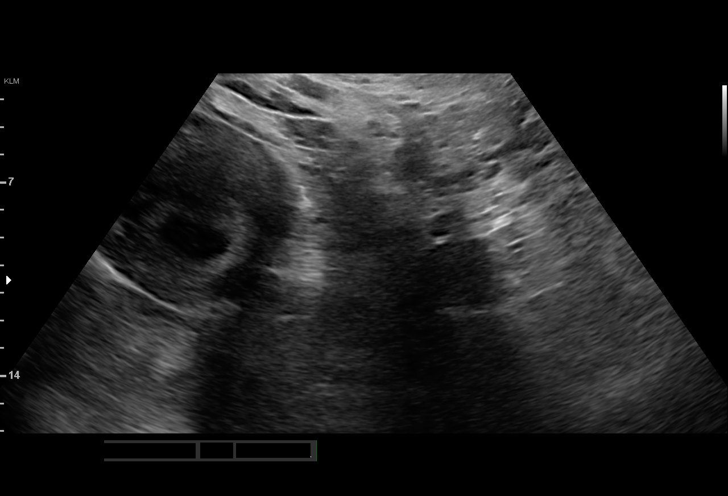
[im 10/43]
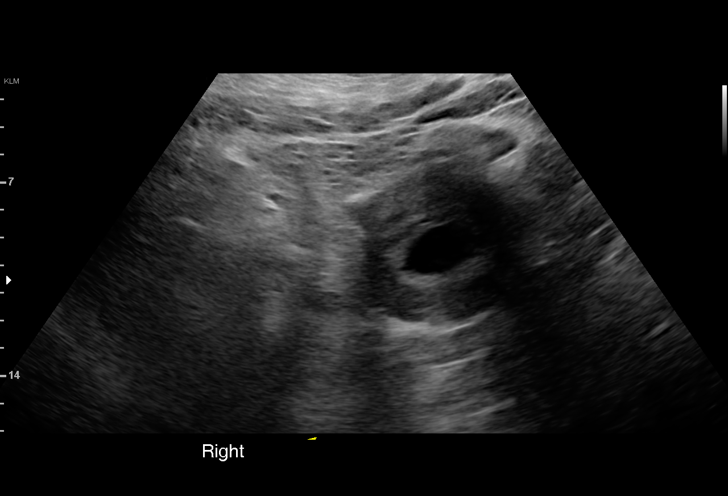
[im 13/43]
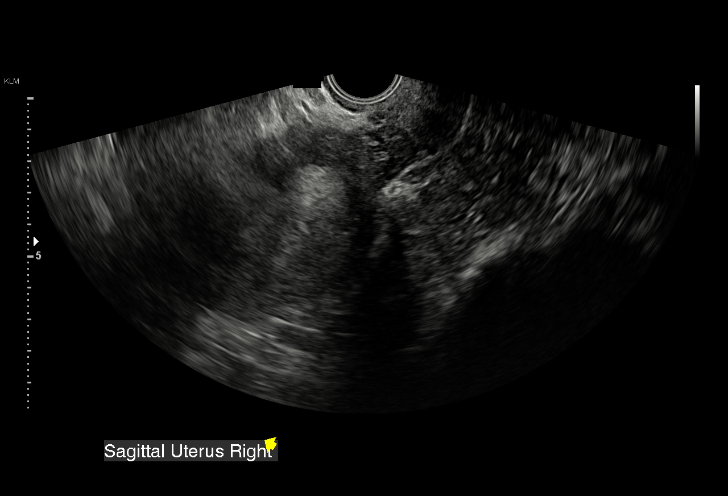
[im 16/43]
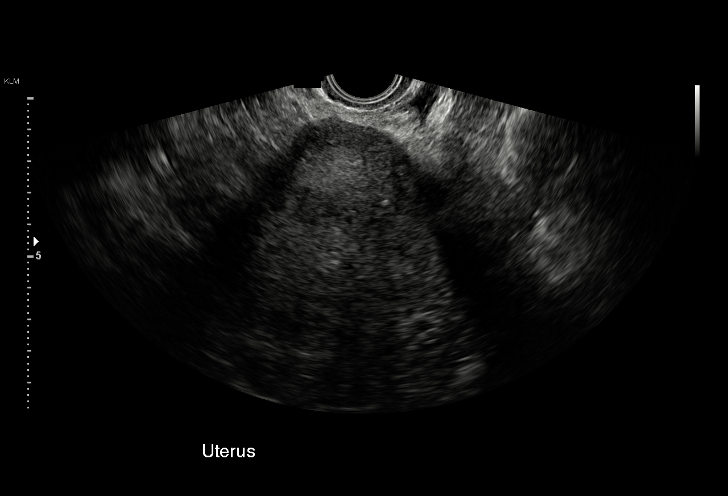
[im 19/43]
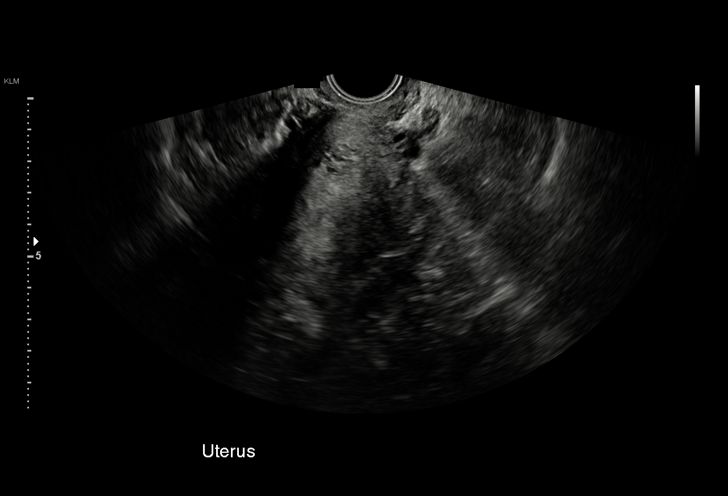
[im 22/43]
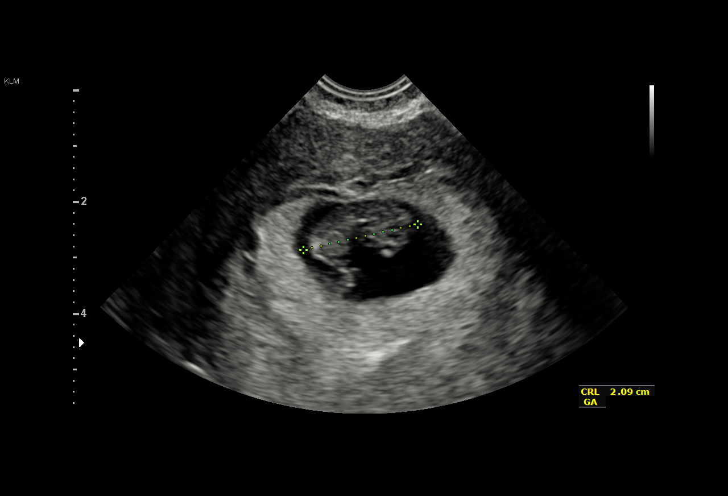
[im 24/43]
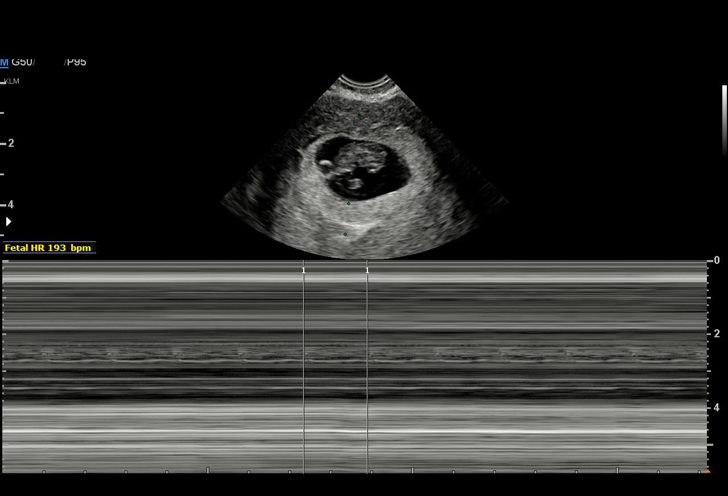
[im 27/43]
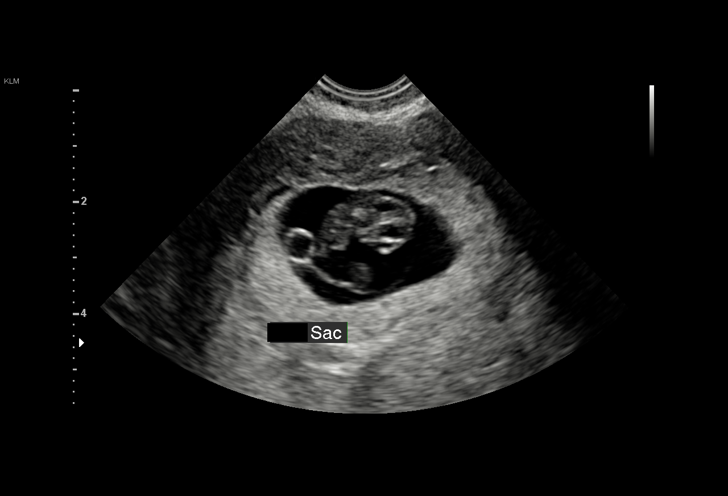
[im 30/43]
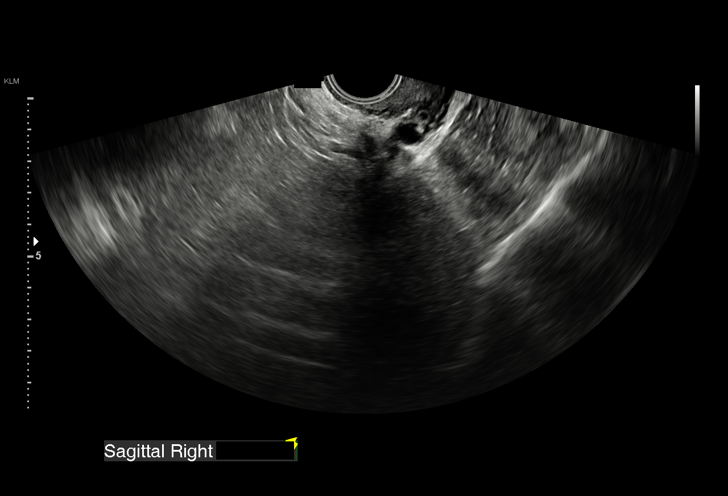
[im 33/43]
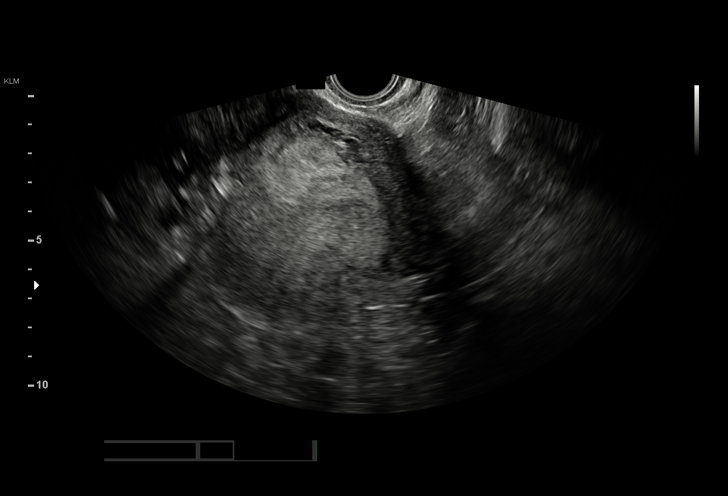
[im 36/43]
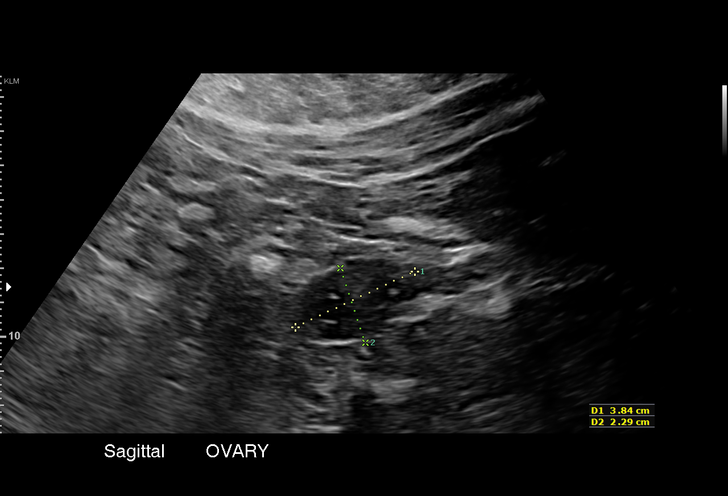
[im 39/43]
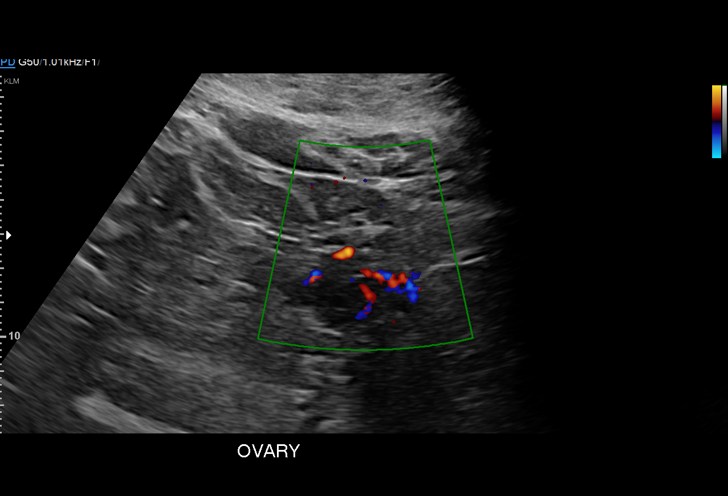
[im 43/43]
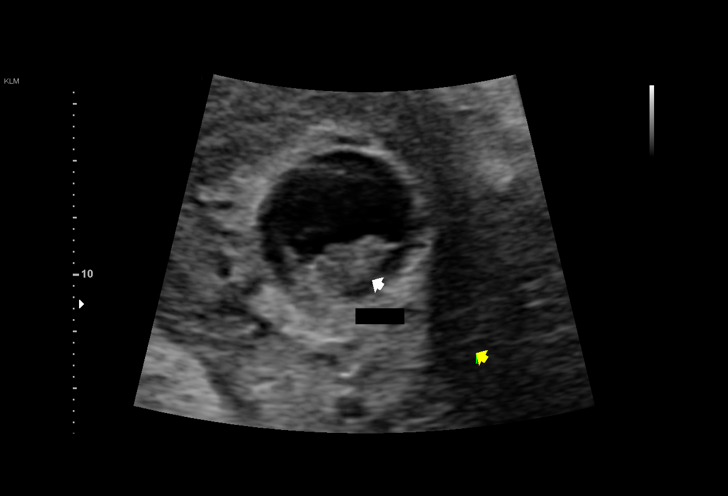

[15 of 28 positions shown; findings below may reference images not displayed]

FINDINGS: Intrauterine gestational sac: Visualized

Yolk sac:  Visualized

Embryo:  Visualized

Cardiac Activity: Visualized

Heart Rate: 188  bpm

CRL:  20.4  mm   8 w   4 d                  US EDC: 07/16/2017

Subchorionic hemorrhage:  None visualized.

Maternal uterus/adnexae: Normal appearing ovaries. No free
peritoneal fluid.
IMPRESSION: Single live intrauterine gestation with an estimated gestational age
of 8 weeks and 4 days. No complicating features.

## 2018-08-18 DIAGNOSIS — E785 Hyperlipidemia, unspecified: Secondary | ICD-10-CM | POA: Diagnosis not present

## 2018-08-18 DIAGNOSIS — E559 Vitamin D deficiency, unspecified: Secondary | ICD-10-CM | POA: Diagnosis not present

## 2018-08-18 DIAGNOSIS — Z0000 Encounter for general adult medical examination without abnormal findings: Secondary | ICD-10-CM | POA: Diagnosis not present

## 2018-08-18 DIAGNOSIS — Z23 Encounter for immunization: Secondary | ICD-10-CM | POA: Diagnosis not present

## 2018-08-18 DIAGNOSIS — I10 Essential (primary) hypertension: Secondary | ICD-10-CM | POA: Diagnosis not present

## 2018-08-18 DIAGNOSIS — F321 Major depressive disorder, single episode, moderate: Secondary | ICD-10-CM | POA: Diagnosis not present

## 2018-08-18 DIAGNOSIS — R5383 Other fatigue: Secondary | ICD-10-CM | POA: Diagnosis not present

## 2018-09-17 ENCOUNTER — Ambulatory Visit (HOSPITAL_COMMUNITY): Payer: BLUE CROSS/BLUE SHIELD | Attending: Nurse Practitioner | Admitting: Physical Therapy

## 2018-09-17 ENCOUNTER — Encounter (HOSPITAL_COMMUNITY): Payer: Self-pay | Admitting: Physical Therapy

## 2018-09-17 ENCOUNTER — Other Ambulatory Visit: Payer: Self-pay

## 2018-09-17 DIAGNOSIS — M6281 Muscle weakness (generalized): Secondary | ICD-10-CM | POA: Diagnosis not present

## 2018-09-17 DIAGNOSIS — M5416 Radiculopathy, lumbar region: Secondary | ICD-10-CM

## 2018-09-17 NOTE — Therapy (Signed)
[place], [place], [address] Phone: [phone]   Fax:  [phone]  Physical Therapy Evaluation  Patient Details  Name: Janet Deleon MRN: [id number] Date of Birth: Apr 28, 1981 Referring Provider (PT): Jeralyn Ruths    Encounter Date: 09/17/2018  PT End of Session - 09/17/18 1621    Visit Number  1    Number of Visits  8    Date for PT Re-Evaluation  10/17/18    Authorization Type  [employer]    Authorization - Visit Number  1    Authorization - Number of Visits  30    PT Start Time  1430    PT Stop Time  1515    PT Time Calculation (min)  45 min    Activity Tolerance  Patient tolerated treatment well    Behavior During Therapy  [facility] for tasks assessed/performed       Past Medical History:  Diagnosis Date  . Anemia   . Hypertension   . PCOS (polycystic ovarian syndrome)     Past Surgical History:  Procedure Laterality Date  . CERVICAL CERCLAGE N/A 03/10/2017   Procedure: Emergency CERVICAL CERCLAGE, Excision Vaginal Septum;  Surgeon: Azucena Fallen, MD;  Location: [facility];  Service: Gynecology;  Laterality: N/A;  EDD: 07/17/17 Allergy: Sulfa, Demerol  . lt eye surgery    . OVARIAN CYST REMOVAL     1996  . TONSILLECTOMY      There were no vitals filed for this visit.   Subjective Assessment - 09/17/18 1435    Subjective  Ms. Eccleston states that she can never remember when she did not have back pain but it increased significantly when she was pregnant with her child.  The pain has continued since the child was born.  At this time she is having radicular sx in both LE but more so in her Lt LE.  She has had no images.  She is being referred to thearpy at this time to try and decrease her sx and improve her functional ability.     Limitations  Sitting;Lifting;Standing;Walking;House hold activities    How long can you sit comfortably?  30-45 minutes and then her legs are numb     How long can you stand comfortably?  30-45  minutes     How long can you walk comfortably?  30-45 mintues     Diagnostic tests  none    Patient Stated Goals  less numbness, less falling     Currently in Pain?  Yes    Pain Score  5    worst 7/10; best  0/10    Pain Location  Buttocks    Pain Orientation  Right;Left    Pain Descriptors / Indicators  Numbness;Tingling    Pain Type  Chronic pain    Pain Radiating Towards  to knee level     Pain Onset  More than a month ago    Pain Frequency  Intermittent    Aggravating Factors   sitting     Pain Relieving Factors  moving around     Effect of Pain on Daily Activities  limits         [facility] PT Assessment - 09/17/18 0001      Assessment   Medical Diagnosis  B radiculopathy    Referring Provider (PT)  Jeralyn Ruths     Onset Date/Surgical Date  07/04/17    Next MD Visit  09/23/2018    Prior Therapy  none      Precautions   Precautions  Fall      Balance Screen   Has the patient fallen in the past 6 months  Yes    How many times?  2    Has the patient had a decrease in activity level because of a fear of falling?   Yes    Is the patient reluctant to leave their home because of a fear of falling?   No      Prior Function   Level of Independence  Independent      Cognition   Overall Cognitive Status  Within Functional Limits for tasks assessed      Observation/Other Assessments   Focus on Therapeutic Outcomes (FOTO)   44      Functional Tests   Functional tests  Single leg stance;Sit to Stand      Single Leg Stance   Comments  RT:    12     ; LT: 20"      Sit to Stand   Comments  5  x 10.98       Posture/Postural Control   Posture/Postural Control  Postural limitations    Postural Limitations  Rounded Shoulders;Forward head;Decreased lumbar lordosis;Increased thoracic kyphosis      ROM / Strength   AROM / PROM / Strength  AROM;Strength      AROM   AROM Assessment Site  Hip;Knee;Ankle;Lumbar    Lumbar Flexion  able to touch toes    reps cause increased pain  going down is more painful than up   Lumbar Extension  25   reps no change      Strength   Strength Assessment Site  Hip;Knee;Ankle    Right/Left Hip  Right;Left    Right Hip Flexion  4+/5    Right Hip Extension  4/5    Right Hip ABduction  3/5    Left Hip Flexion  4/5    Left Hip Extension  2-/5    Left Hip ABduction  4/5    Right/Left Knee  Right;Left    Right Knee Flexion  4/5    Right Knee Extension  4+/5    Left Knee Flexion  4/5    Left Knee Extension  4+/5    Right/Left Ankle  Right;Left    Right Ankle Dorsiflexion  5/5    Left Ankle Dorsiflexion  5/5                Objective measurements completed on examination: See above findings.      [facility] Adult PT Treatment/Exercise - 09/17/18 0001      Exercises   Exercises  Lumbar      Lumbar Exercises: Supine   Ab Set  10 reps    Bridge  10 reps      Lumbar Exercises: Sidelying   Hip Abduction  Both;5 reps             PT Education - 09/17/18 1620    Education Details  The importance of core strength, HEP; proper bed mobilty to decrease stress of lumbar spine     Person(s) Educated  Patient    Methods  Explanation    Comprehension  Verbalized understanding       PT Short Term Goals - 09/17/18 1629      PT SHORT TERM GOAL #1   Title  Pt radicular sx to be no further than mid thigh to demonstrate decreased nerve root irritation.    Time  2    Period  Weeks    Status  New    Target Date  10/01/18      PT SHORT TERM GOAL #2   Title  PT to be able to demonstrate good body mechanics to decrease lower back pain     Time  2    Period  Weeks    Status  New      PT SHORT TERM GOAL #3   Title  PT to LE strength be increased 1/2 grade to allow pt to come from sit to stand from a low lying couch with ease.     Time  2    Period  Weeks    Status  New      PT SHORT TERM GOAL #4   Title  PT pain to be no greater than a 5 to allow pt to complete household activity with less discomfort.     Time  2     Period  Weeks    Status  New        PT Long Term Goals - 09/17/18 1635      PT LONG TERM GOAL #1   Title  PT radicular sx to by to buttock area only to demonstrate decreased nerve irritation    Time  4    Period  Weeks    Status  New    Target Date  10/15/18      PT LONG TERM GOAL #2   Title  PT to understand which exercises that she can perform to relieve her back pain.     Time  4    Period  Weeks    Status  New      PT LONG TERM GOAL #3   Title  PT to be able to single leg stance for at least 30 seconds on B LE to reduce her risk of falling.     Time  4    Period  Weeks    Status  New             Plan - 09/17/18 1623    Clinical Impression Statement  Ms. Simi is a 38 yo female who has had B radiculopathy to the knee level for years although it has exacerbated in the past six months.  She states that she has tingling and her legs go numb and at times she just falls.  She has not had any imaging; she has been rerferred to skilled PT.  Evaluation demonsrates decreased activity tolerance, decreased balance, decreased strength, postural dysfunction, increased pain and obesity.  Ms. Burich will benefit from skilled physical therapy to address these issues and maximize her functional activity level.      Clinical Presentation  Stable    Clinical Decision Making  Moderate    Rehab Potential  Fair    PT Frequency  2x / week    PT Duration  4 weeks    PT Treatment/Interventions  Patient/family education;Therapeutic activities;Therapeutic exercise;Aquatic Therapy;ADLs/Self Care Home Management;Balance training;Neuromuscular re-education    PT Next Visit Plan  Continue stabilization exercises focusing on core strength and posture.  Begin postural t-band exercises, functional squat, bent knee raise progress as able     PT Home Exercise Plan  eval:  hip abduction, ab set and bridge.     Consulted and Agree with Plan of Care  Patient       Patient will benefit from skilled  therapeutic intervention in order to improve the following  deficits and impairments:  Decreased activity tolerance, Decreased balance, Decreased strength, Obesity, Pain, Improper body mechanics  Visit Diagnosis: Radiculopathy, lumbar region - Plan: PT plan of care cert/re-cert  Muscle weakness (generalized) - Plan: PT plan of care cert/re-cert     Problem List Patient Active Problem List   Diagnosis Date Noted  . SVD (spontaneous vaginal delivery) 10/26 07/04/2017  . Postpartum care following vaginal delivery 10/26 07/04/2017  . 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 Incompetent cervix in pregnancy, antepartum, second trimester 03/10/2017  . Benign essential hypertension 08/23/2014  . Morbid obesity ([place]) 08/23/2014  . Vitamin D deficiency disease 08/23/2014    Rayetta Humphrey, PT CLT [phone] 09/17/2018, 4:44 PM  [facility] [address] [place], [place], [address] Phone: [phone]   Fax:  [phone]  Name: Rhaya Coale MRN: [id number] Date of Birth: 1981/08/08

## 2018-09-17 NOTE — Patient Instructions (Addendum)
Bridging    Slowly raise buttocks from floor, keeping stomach tight. Repeat _10___ times per set. Do ___1_ sets per session. Do ___2_ sessions per day.  http://orth.exer.us/1096   Copyright  VHI. All rights reserved.  Isometric Abdominal    Lying on back with knees bent, tighten stomach by pressing elbows down. Hold __5__ seconds. Repeat __10__ times per set. Do __1__ sets per session. Do _4-5___ sessions per day.  http://orth.exer.us/1086   Copyright  VHI. All rights reserved.  Strengthening: Hip Abduction (Side-Lying)    Tighten muscles on front of left thigh, then lift leg 12____ inches from surface, keeping knee locked.  Repeat __10__ times per set. Do __1__ sets per session. Do __2__ sessions per day.  http://orth.exer.us/622   Copyright  VHI. All rights reserved.

## 2018-09-24 ENCOUNTER — Telehealth (HOSPITAL_COMMUNITY): Payer: Self-pay | Admitting: Internal Medicine

## 2018-09-24 NOTE — Telephone Encounter (Signed)
09/24/18  Pt called to cx but didn't give a reason

## 2018-09-25 ENCOUNTER — Encounter (HOSPITAL_COMMUNITY): Payer: BLUE CROSS/BLUE SHIELD | Admitting: Physical Therapy

## 2018-09-28 ENCOUNTER — Telehealth (HOSPITAL_COMMUNITY): Payer: Self-pay | Admitting: Physical Therapy

## 2018-09-28 NOTE — Telephone Encounter (Signed)
Patient had to go out of town and will not be back until next week

## 2018-09-29 ENCOUNTER — Ambulatory Visit (HOSPITAL_COMMUNITY): Payer: BLUE CROSS/BLUE SHIELD | Admitting: Physical Therapy

## 2018-10-01 ENCOUNTER — Encounter (HOSPITAL_COMMUNITY): Payer: BLUE CROSS/BLUE SHIELD | Admitting: Physical Therapy

## 2018-10-01 DIAGNOSIS — R5383 Other fatigue: Secondary | ICD-10-CM | POA: Diagnosis not present

## 2018-10-01 DIAGNOSIS — D500 Iron deficiency anemia secondary to blood loss (chronic): Secondary | ICD-10-CM | POA: Diagnosis not present

## 2018-10-01 DIAGNOSIS — I10 Essential (primary) hypertension: Secondary | ICD-10-CM | POA: Diagnosis not present

## 2018-10-01 DIAGNOSIS — D509 Iron deficiency anemia, unspecified: Secondary | ICD-10-CM | POA: Diagnosis not present

## 2018-10-01 DIAGNOSIS — M5416 Radiculopathy, lumbar region: Secondary | ICD-10-CM | POA: Diagnosis not present

## 2018-10-06 ENCOUNTER — Telehealth (HOSPITAL_COMMUNITY): Payer: Self-pay

## 2018-10-06 ENCOUNTER — Ambulatory Visit (HOSPITAL_COMMUNITY): Payer: BLUE CROSS/BLUE SHIELD | Admitting: Physical Therapy

## 2018-10-06 NOTE — Telephone Encounter (Signed)
No show #1, called and left message concerning missed apt today.  Included next apt date and time wiht contact information given.  Also educated on no show policy.  [address] [place]; [employer] [phone]

## 2018-10-08 ENCOUNTER — Ambulatory Visit (HOSPITAL_COMMUNITY): Payer: BLUE CROSS/BLUE SHIELD

## 2018-10-08 ENCOUNTER — Telehealth (HOSPITAL_COMMUNITY): Payer: Self-pay

## 2018-10-08 NOTE — Telephone Encounter (Signed)
No Show #2: I called Ms. Sickman at the number available to check in as she did not arrive for her 3:15 pm appointment today and left a voice message. I reminded her of our "no show" policy and that we will have to cancel all but 1 appointment and schedule therapy 1 appointment at a time from now on. I informed her the next appointment is on Tuesday 10/13/2018 at 2:30 PM and asked that she call the front office if she is unable to attend therapy that date. I provided our office number [phone]. I informed her if she does not arrive and does not call to reschedule we will have to discharge her per out attendance policy.   Kipp Brood, PT, DPT, [facility] Physical Therapist with [facility]  10/08/2018 5:03 PM

## 2018-10-13 ENCOUNTER — Ambulatory Visit (HOSPITAL_COMMUNITY): Payer: BLUE CROSS/BLUE SHIELD | Attending: Nurse Practitioner | Admitting: Physical Therapy

## 2018-10-13 ENCOUNTER — Encounter (HOSPITAL_COMMUNITY): Payer: Self-pay | Admitting: Physical Therapy

## 2018-10-13 NOTE — Therapy (Signed)
[place], [place], [address] Phone: [phone]   Fax:  [phone]  Patient Details  Name: Janet Deleon MRN: [id number] Date of Birth: Sep 06, 1981 Referring Provider: Jeralyn Ruths  Encounter Date: 10/13/2018   PHYSICAL THERAPY DISCHARGE SUMMARY  Visits from Start of Care: 1  Current functional level related to goals / functional outcomes: unknown   Remaining deficits: unknown   Education / Equipment: HEP Plan: Patient agrees to discharge.  Patient goals were not met. Patient is being discharged due to not returning since the last visit.  ?????     Rayetta Humphrey, PT CLT [phone] 10/13/2018, 3:51 PM  [facility] [address] [place], [place], [address] Phone: [phone]   Fax:  [phone]

## 2018-10-15 ENCOUNTER — Encounter (HOSPITAL_COMMUNITY): Payer: BLUE CROSS/BLUE SHIELD | Admitting: Physical Therapy

## 2018-11-27 ENCOUNTER — Encounter (INDEPENDENT_AMBULATORY_CARE_PROVIDER_SITE_OTHER): Payer: Self-pay | Admitting: Internal Medicine

## 2018-12-23 DIAGNOSIS — E282 Polycystic ovarian syndrome: Secondary | ICD-10-CM | POA: Diagnosis not present

## 2018-12-23 DIAGNOSIS — I10 Essential (primary) hypertension: Secondary | ICD-10-CM | POA: Diagnosis not present

## 2019-01-15 IMAGING — US US MFM UA CORD DOPPLER
1 series · 14 of 28 positions shown · non-contrast
Comparison: none

[Series 1: us mfm ua cord doppler · 30 acquisitions, 14 frames shown]
[im 2/30]
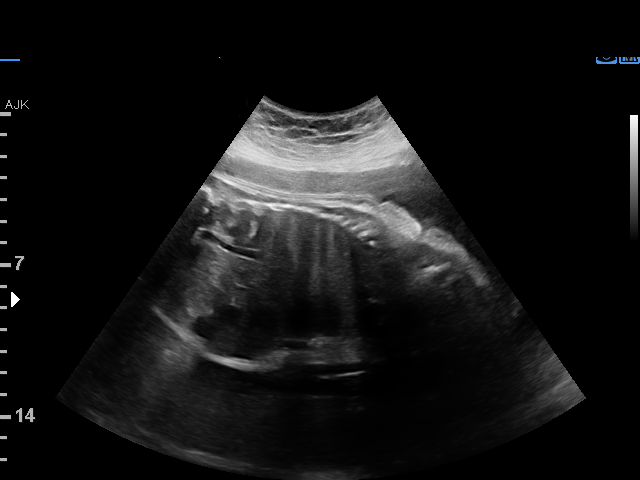
[im 4/30]
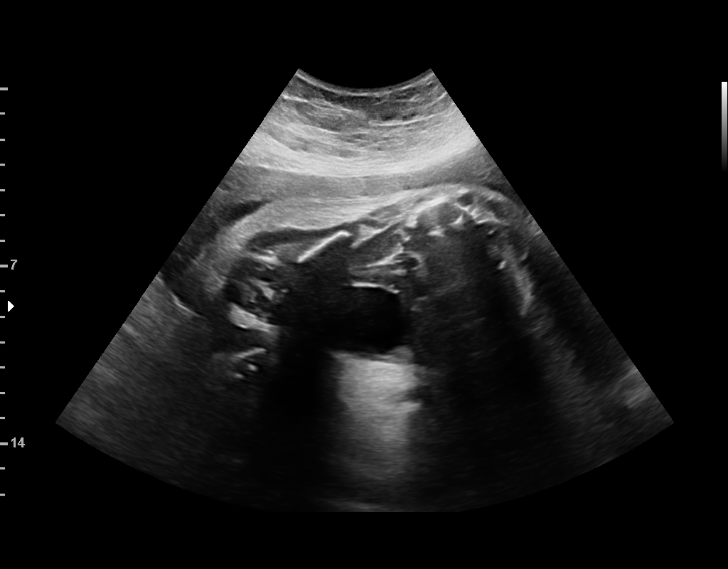
[im 6/30]
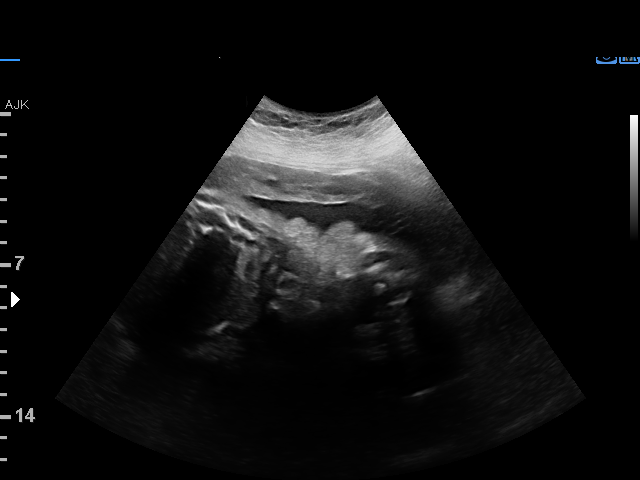
[im 8/30]
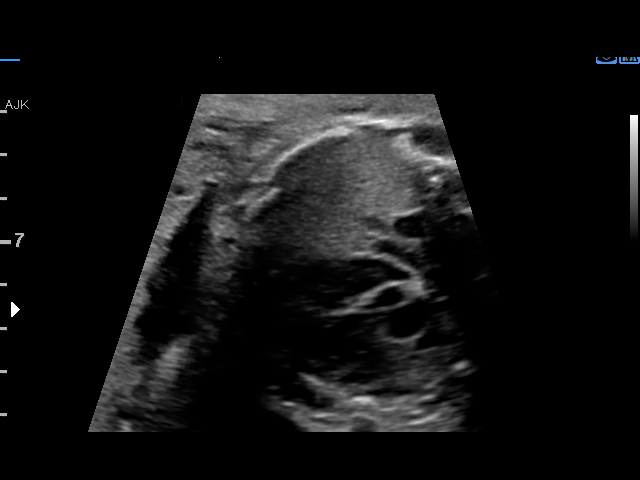
[im 10/30]
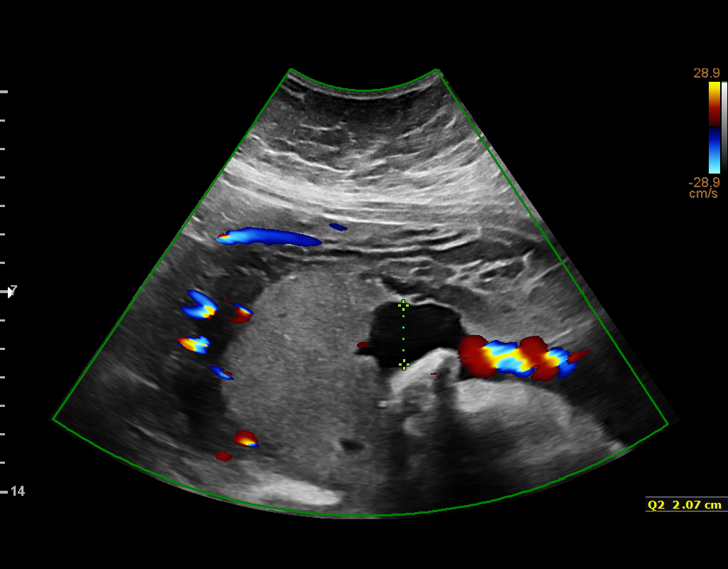
[im 12/30]
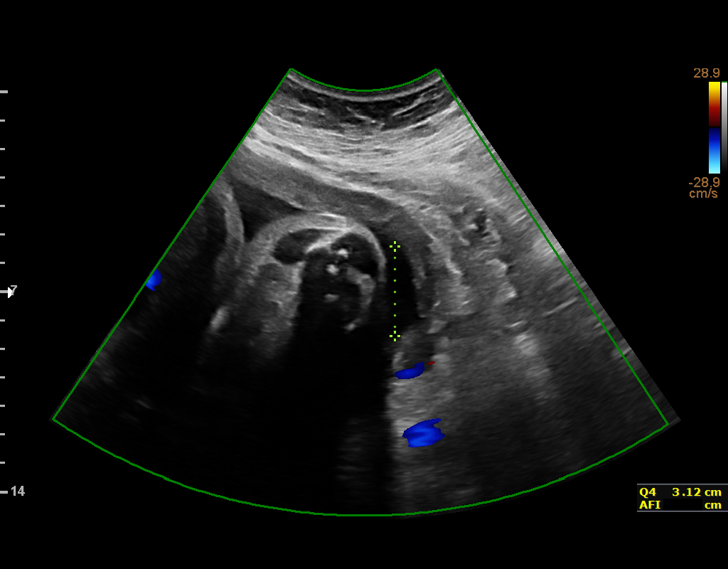
[im 14/30]
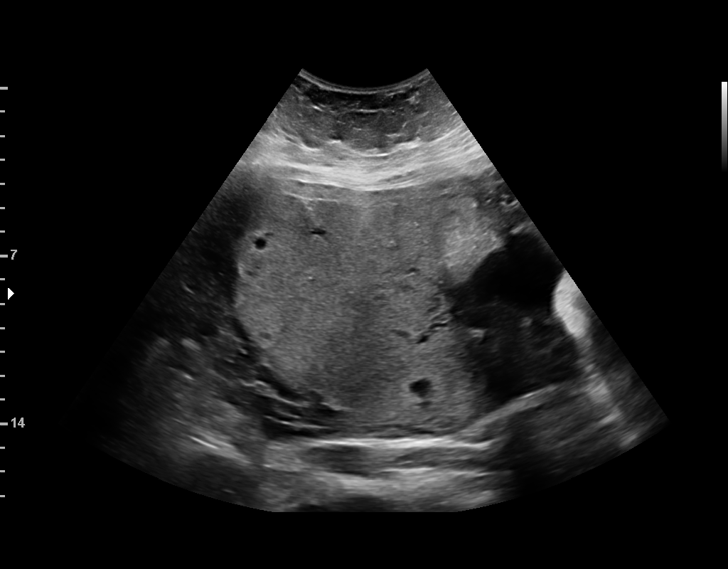
[im 17/30]
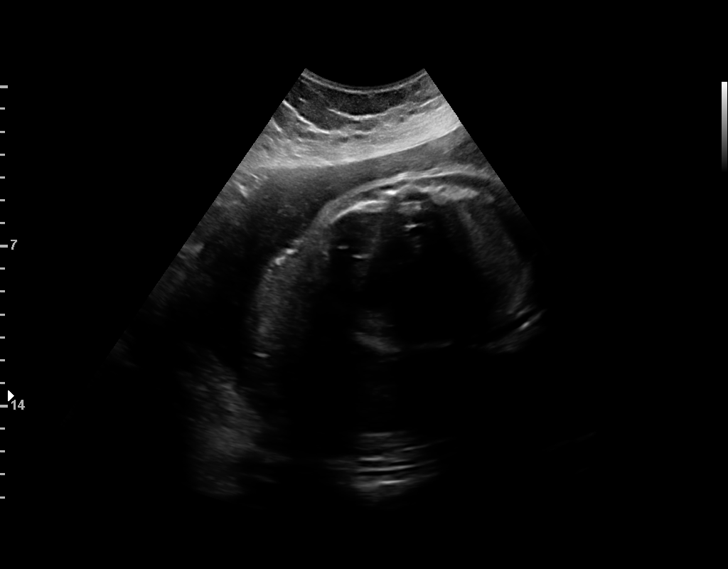
[im 19/30]
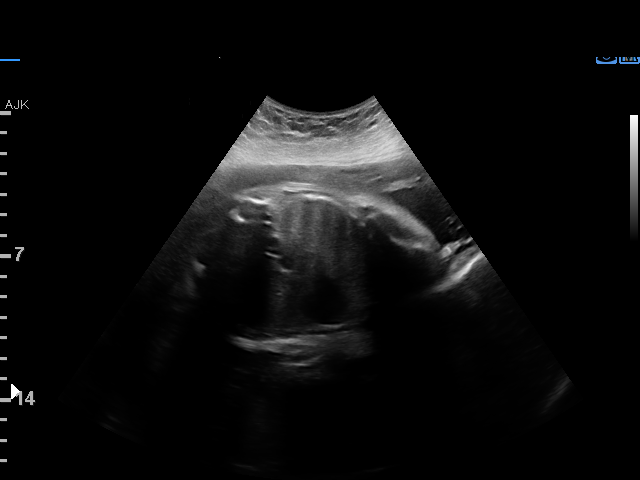
[im 21/30]
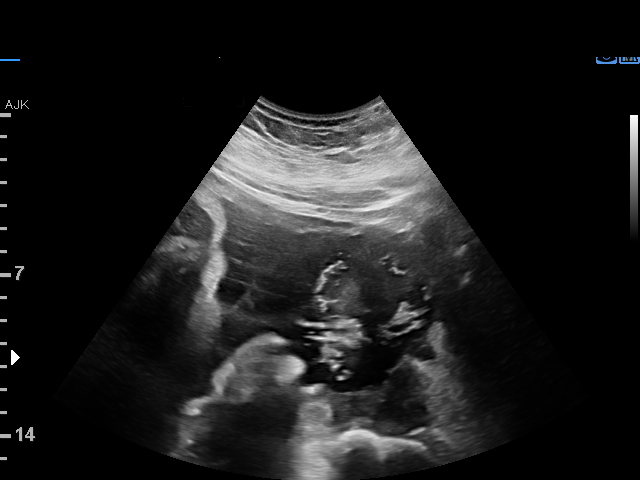
[im 23/30]
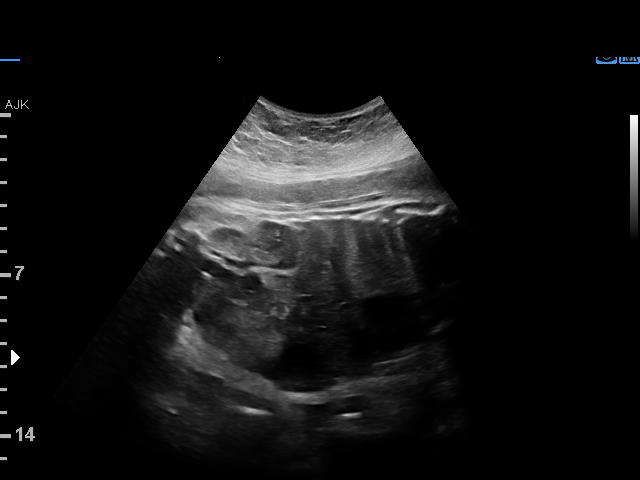
[im 25/30]
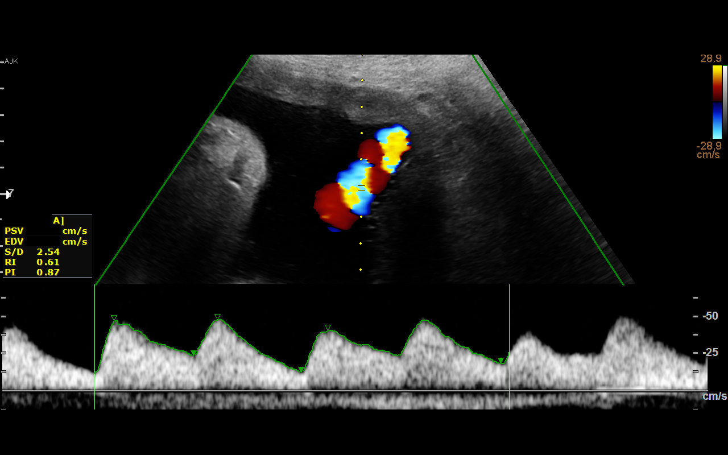
[im 27/30]
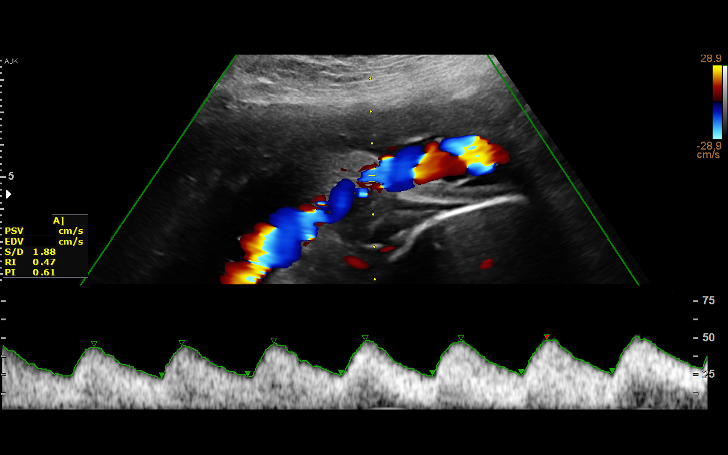
[im 30/30]
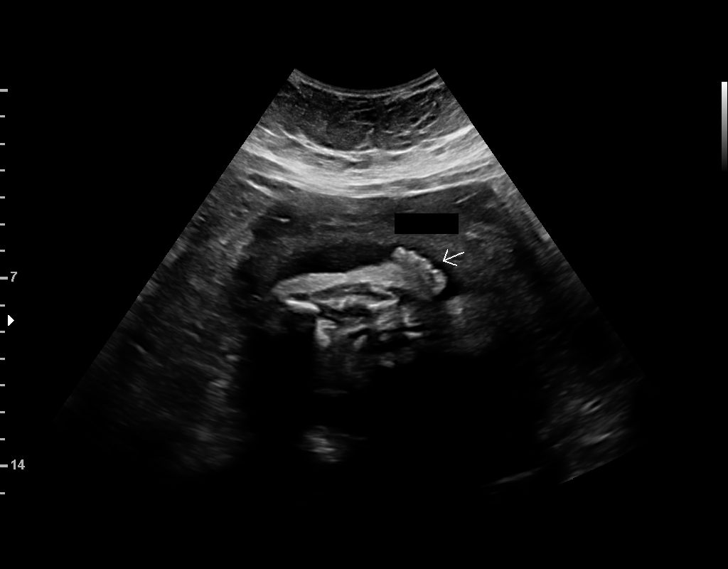

[14 of 28 positions shown; findings below may reference images not displayed]

1  NAZARETH JUMPER              [id number]      [id number]     [id number]
2  NAZARETH JUMPER              [id number]      [id number]     [id number]
Indications

35 weeks gestation of pregnancy
Obesity complicating pregnancy, third
trimester
Cervical incompetence, third trimester
Cervical cerclage suture present, third
trimester
Advanced maternal age primigravida 35+,
third trimester
Hypertension - Chronic/Pre-existing
Small for gestational age fetus affecting
management of mother
Maternal care for known or suspected poor
fetal growth, third trimester, not applicable or
unspecified
OB History

Blood Type:            Height:  5'2"   Weight (lb):  264       BMI:
Gravidity:    1
Fetal Evaluation

Num Of Fetuses:     1
Fetal Heart         162
Rate(bpm):
Cardiac Activity:   Observed
Presentation:       Cephalic
Placenta:           Posterior, above cervical os
P. Cord Insertion:  Previously Visualized
Amniotic Fluid
AFI FV:      Subjectively within normal limits

AFI Sum(cm)     %Tile       Largest Pocket(cm)
10.83           27

RUQ(cm)       RLQ(cm)       LUQ(cm)        LLQ(cm)
2.05
Biophysical Evaluation

Amniotic F.V:   Within normal limits       F. Tone:        Observed
F. Movement:    Observed                   Score:          [DATE]
F. Breathing:   Observed
Gestational Age

LMP:           35w 1d        Date:  10/10/16                 EDD:   07/17/17
Best:          35w 1d     Det. By:  LMP  (10/10/16)          EDD:   07/17/17
Doppler - Fetal Vessels

Umbilical Artery
S/D     %tile     RI              PI              PSV    ADFV    RDFV
(cm/s)
2.18       33   0.54             0.74             52.66      No      No

Impression

Singleton intrauterine pregnancy at 35 weeks 1 day gestation
with fetal cardiac activity
Cephalic presentation
BPP [DATE] with an AFI > 10 cm
UA Dopplers within normal limits
Recommendations

Recommend follow-up ultrasound examination weekly for
BPP with UA Doppler

## 2019-03-02 DIAGNOSIS — N390 Urinary tract infection, site not specified: Secondary | ICD-10-CM | POA: Diagnosis not present

## 2019-06-26 ENCOUNTER — Other Ambulatory Visit (HOSPITAL_COMMUNITY): Payer: Self-pay | Admitting: Internal Medicine

## 2019-09-14 ENCOUNTER — Other Ambulatory Visit (HOSPITAL_COMMUNITY): Payer: Self-pay | Admitting: Internal Medicine

## 2019-09-17 ENCOUNTER — Other Ambulatory Visit: Payer: Self-pay

## 2019-09-17 ENCOUNTER — Ambulatory Visit (INDEPENDENT_AMBULATORY_CARE_PROVIDER_SITE_OTHER): Payer: Self-pay | Admitting: Nurse Practitioner

## 2019-09-17 ENCOUNTER — Encounter (INDEPENDENT_AMBULATORY_CARE_PROVIDER_SITE_OTHER): Payer: Self-pay | Admitting: Nurse Practitioner

## 2019-09-17 DIAGNOSIS — I10 Essential (primary) hypertension: Secondary | ICD-10-CM

## 2019-09-17 MED ORDER — LABETALOL HCL 200 MG PO TABS: 200.0000 mg | ORAL_TABLET | Freq: Every day | ORAL | 0 refills | Status: DC

## 2019-09-17 NOTE — Progress Notes (Signed)
Due to national recommendations of social distancing related to the [place] pandemic, an audio-only tele-health visit was felt to be the most appropriate encounter type for this patient today. I connected with  Janet Deleon on 09/17/19 utilizing audio-only technology and verified that I am speaking with the correct person using two identifiers. The patient was located at their home, and I was located at the office of [facility] during the encounter. I discussed the limitations of evaluation and management by telemedicine. The patient expressed understanding and agreed to proceed.      Subjective:  Patient ID: Janet Deleon, female    DOB: 1981-01-01  Age: 39 y.o. MRN: [id number]  CC:  Chief Complaint  Patient presents with  . Follow-up      HPI  This patient arrives for virtual visit today for follow-up regarding chronic conditions specifically hypertension.  She is almost out of her medications and needs to be have a visit prior to having her antihypertensive refilled.  She tells me she continues on labetalol 200 mg daily and nifedipine 30 mg daily.  She does have a blood pressure cuff at home but does not have batteries that allow her to operate it.  She has been feeling well on her current medications.  Past Medical History:  Diagnosis Date  . Anemia   . Hypertension   . PCOS (polycystic ovarian syndrome)       Family History  Problem Relation Age of Onset  . Hypertension Father   . Heart attack Father   . Diabetes Father   . Liver cancer Paternal Grandmother   . Lung cancer Paternal Grandmother     Social History   Social History Narrative  . Not on file   Social History   Tobacco Use  . Smoking status: Former Research scientist (life sciences)  . Smokeless tobacco: Never Used  Substance Use Topics  . Alcohol use: No    Alcohol/week: 0.0 standard drinks     Current Meds  Medication Sig  . acetaminophen (TYLENOL) 325 MG tablet Take 2 tablets (650 mg total) by mouth every 4  (four) hours as needed (for pain scale < 4).  . ibuprofen (ADVIL,MOTRIN) 600 MG tablet Take 1 tablet (600 mg total) by mouth every 6 (six) hours.  Marland Kitchen NIFEdipine (ADALAT CC) 30 MG 24 hr tablet Take 1 tablet by mouth once daily  . [DISCONTINUED] labetalol (NORMODYNE) 100 MG tablet Take 200 mg by mouth daily.   Marland Kitchen labetalol (NORMODYNE) 200 MG tablet Take 1 tablet (200 mg total) by mouth daily.    ROS:  Review of Systems  Constitutional: Negative for fever.  Eyes: Negative for blurred vision.  Respiratory: Negative.   Cardiovascular: Negative for chest pain and palpitations.  Gastrointestinal: Negative for blood in stool.  Neurological: Negative for dizziness.     Objective:   Today's Vitals: LMP 09/17/2019 (LMP Unknown)  Vitals with BMI 07/06/2017 07/06/2017 07/05/2017  Height - - -  Weight - - -  BMI - - -  Systolic [id number]  Diastolic 69 65 80  Pulse 79 65 70     Physical Exam Physical exam not completed today as office visit was conducted remotely.  She did sound well over the phone.  She was alert and oriented.  She answered questions appropriately.      Assessment   1. Benign essential hypertension       Tests ordered No orders of the defined types were placed in this encounter.  Plan: Please see assessment and plan per problem list below.   Meds ordered this encounter  Medications  . labetalol (NORMODYNE) 200 MG tablet    Sig: Take 1 tablet (200 mg total) by mouth daily.    Dispense:  90 tablet    Refill:  0    Order Specific Question:   Supervising Provider    Answer:   Doree Albee [id number]    Patient to follow-up as scheduled in 1 month.  Telephone conversation lasted for approximately 7 minutes today.  Ailene Ards, NP

## 2019-09-17 NOTE — Patient Instructions (Signed)
Thank you for choosing [facility] as your medical provider! If you have any questions or concerns regarding your health care, please do not hesitate to call our office.  Continue taking your medication as prescribed.  I did refill your labetalol today.  Please follow-up as scheduled in 1 month. We look forward to seeing you again soon!   At [facility] we value your feedback. You may receive a survey about your visit today. Please share your experience as we strive to create trusting relationships with our patients to provide genuine, compassionate, quality care.  We appreciate your understanding and patience as we review any laboratory studies, imaging, and other diagnostic tests that are ordered as we care for you. We do our best to address any and all results in a timely manner. If you do not hear about test results within 1 week, please do not hesitate to contact us. If we referred you to a specialist during your visit or ordered imaging testing, contact the office if you have not been contacted to be scheduled within 1 weeks.  We also encourage the use of MyChart, which contains your medical information for your review as well. If you are not enrolled in this feature, an access code is on this after visit summary for your convenience. Thank you for allowing [place] to be involved in your care.

## 2019-09-17 NOTE — Assessment & Plan Note (Signed)
I will refill her labetalol.  She will continue taking her nifedipine as prescribed as well.  She will follow-up in approximately 1 month for face-to-face exam.  She is also due for blood work at that time.

## 2019-09-27 ENCOUNTER — Other Ambulatory Visit (HOSPITAL_COMMUNITY): Payer: Self-pay | Admitting: Internal Medicine

## 2019-10-18 ENCOUNTER — Ambulatory Visit (INDEPENDENT_AMBULATORY_CARE_PROVIDER_SITE_OTHER): Payer: 59 | Admitting: Internal Medicine

## 2019-10-18 ENCOUNTER — Encounter (INDEPENDENT_AMBULATORY_CARE_PROVIDER_SITE_OTHER): Payer: Self-pay | Admitting: Internal Medicine

## 2019-10-18 ENCOUNTER — Other Ambulatory Visit: Payer: Self-pay

## 2019-10-18 VITALS — BP 130/72 | HR 88 | Temp 98.6°F | Resp 18 | Ht 62.0 in | Wt 281.6 lb

## 2019-10-18 DIAGNOSIS — E559 Vitamin D deficiency, unspecified: Secondary | ICD-10-CM

## 2019-10-18 DIAGNOSIS — I10 Essential (primary) hypertension: Secondary | ICD-10-CM

## 2019-10-18 NOTE — Progress Notes (Signed)
Metrics: Intervention Frequency ACO  Documented Smoking Status Yearly  Screened one or more times in 24 months  Cessation Counseling or  Active cessation medication Past 24 months  Past 24 months   Guideline developer: UpToDate (See UpToDate for funding source) Date Released: 2014       Wellness Office Visit  Subjective:  Patient ID: Janet Deleon, female    DOB: 1981/06/19  Age: 39 y.o. MRN: [id number]  CC: This lady comes in for follow-up of hypertension, morbid obesity. HPI  She is doing reasonably well.  She had a telemedicine visit with Judson Roch in January so she could get refill of her medications.  She has had a lot of stress with her baby who has been sick recently.  She denies any chest pain, dyspnea or palpitations or limb weakness. She is compliant with her antihypertensive medications. Nutrition has not been consistent in trying to help her lose weight. Past Medical History:  Diagnosis Date  . Anemia   . Hypertension   . PCOS (polycystic ovarian syndrome)       Family History  Problem Relation Age of Onset  . Hypertension Father   . Heart attack Father   . Diabetes Father   . Liver cancer Paternal Grandmother   . 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   Social History   Tobacco Use  . Smoking status: Former Research scientist (life sciences)  . Smokeless tobacco: Never Used  Substance Use Topics  . Alcohol use: No    Alcohol/week: 0.0 standard drinks    Current Meds  Medication Sig  . labetalol (NORMODYNE) 200 MG tablet Take 1 tablet (200 mg total) by mouth daily.  Marland Kitchen NIFEdipine (ADALAT CC) 30 MG 24 hr tablet Take 1 tablet by mouth once daily      Objective:   Today's Vitals: BP 130/72 (BP Location: Left Arm, Patient Position: Sitting, Cuff Size: Normal)   Pulse 88   Temp 98.6 F (37 C) (Temporal)   Resp 18   Ht 5\' 2"  (1.575 m)   Wt 281 lb 9.6 oz (127.7 kg)   SpO2  98%   BMI 51.51 kg/m  Vitals with BMI 10/18/2019 07/06/2017 07/06/2017  Height 5\' 2"  - -  Weight 281 lbs 10 oz - -  BMI 123XX123 - -  Systolic [id number] [id number]  Diastolic 72 69 65  Pulse 88 79 65     Physical Exam       Assessment   1. Benign essential hypertension   2. Morbid obesity ([place])   3. Vitamin D deficiency disease       Tests ordered Orders Placed This Encounter  Procedures  . COMPLETE METABOLIC PANEL WITH GFR  . VITAMIN D 25 Hydroxy (Vit-D Deficiency, Fractures)     Plan: 1. She looks systemically well.  Blood pressure is controlled at the present time. 2. Blood work is ordered as above. 3. Further recommendations will depend on blood results and she will follow-up with Sarah in 3 months time.   No orders of the defined types were placed in this encounter.   Doree Albee, MD

## 2019-10-19 LAB — COMPLETE METABOLIC PANEL WITH GFR
AG Ratio: 1.5 (calc) (ref 1.0–2.5)
ALT: 13 U/L (ref 6–29)
AST: 12 U/L (ref 10–30)
Albumin: 4 g/dL (ref 3.6–5.1)
Alkaline phosphatase (APISO): 72 U/L (ref 31–125)
BUN: 11 mg/dL (ref 7–25)
CO2: 24 mmol/L (ref 20–32)
Calcium: 9.3 mg/dL (ref 8.6–10.2)
Chloride: 105 mmol/L (ref 98–110)
Creat: 0.87 mg/dL (ref 0.50–1.10)
GFR, Est African American: 96 mL/min/{1.73_m2} (ref >=60)
GFR, Est Non African American: 83 mL/min/{1.73_m2} (ref >=60)
Globulin: 2.6 g/dL (calc) (ref 1.9–3.7)
Glucose, Bld: 93 mg/dL (ref 65–99)
Potassium: 4.3 mmol/L (ref 3.5–5.3)
Sodium: 138 mmol/L (ref 135–146)
Total Bilirubin: 0.4 mg/dL (ref 0.2–1.2)
Total Protein: 6.6 g/dL (ref 6.1–8.1)

## 2019-10-19 LAB — VITAMIN D 25 HYDROXY (VIT D DEFICIENCY, FRACTURES): Vit D, 25-Hydroxy: 9 ng/mL — ABNORMAL LOW (ref 30–100)

## 2019-12-15 ENCOUNTER — Other Ambulatory Visit (HOSPITAL_COMMUNITY): Payer: Self-pay | Admitting: Internal Medicine

## 2019-12-15 ENCOUNTER — Other Ambulatory Visit (INDEPENDENT_AMBULATORY_CARE_PROVIDER_SITE_OTHER): Payer: Self-pay | Admitting: Nurse Practitioner

## 2019-12-15 DIAGNOSIS — I10 Essential (primary) hypertension: Secondary | ICD-10-CM

## 2019-12-16 ENCOUNTER — Encounter (INDEPENDENT_AMBULATORY_CARE_PROVIDER_SITE_OTHER): Payer: Self-pay

## 2020-01-17 ENCOUNTER — Ambulatory Visit (INDEPENDENT_AMBULATORY_CARE_PROVIDER_SITE_OTHER): Payer: 59 | Admitting: Nurse Practitioner

## 2020-01-17 ENCOUNTER — Encounter (INDEPENDENT_AMBULATORY_CARE_PROVIDER_SITE_OTHER): Payer: Self-pay | Admitting: Nurse Practitioner

## 2020-01-17 ENCOUNTER — Other Ambulatory Visit: Payer: Self-pay

## 2020-01-17 VITALS — BP 145/90 | HR 93 | Temp 98.2°F | Ht 62.0 in | Wt 287.6 lb

## 2020-01-17 DIAGNOSIS — M79671 Pain in right foot: Secondary | ICD-10-CM

## 2020-01-17 DIAGNOSIS — I10 Essential (primary) hypertension: Secondary | ICD-10-CM

## 2020-01-17 MED ORDER — IBUPROFEN 600 MG PO TABS: 600.0000 mg | ORAL_TABLET | Freq: Three times a day (TID) | ORAL | 0 refills | Status: DC | PRN

## 2020-01-17 NOTE — Progress Notes (Signed)
Subjective:  Patient ID: Janet Deleon, female    DOB: 05/28/1981  Age: 39 y.o. MRN: [id number]  CC:  Chief Complaint  Patient presents with  . Hypertension  . Follow-up      HPI  This patient arrives today for office visit regarding her hypertension but also has concerns regarding right heel pain.  Hypertension: She continues on labetalol and nifedipine to control her elevated blood pressure.  She tells me that she did not take her medications today.  Heel pain: She tells me she has had right heel pain that has been ongoing for approximately 4 weeks.  She tells me the pain is worse immediately upon standing/walking after rest, but starts to feel better after activity.  It radiates up the lateral aspect of her foot, she rates the pain at a 10/10 in intensity.  As stated before when she starts walking the intensity reduces.  She has been taking Tylenol as needed with no to mild effect.  She tells me in the past she has broken bones in her right foot, and is concerned that this pain may be related to the previous break.  She denies any weakness or sensation changes.   Past Medical History:  Diagnosis Date  . Anemia   . Hypertension   . PCOS (polycystic ovarian syndrome)       Family History  Problem Relation Age of Onset  . Hypertension Father   . Heart attack Father   . Diabetes Father   . Liver cancer Paternal Grandmother   . 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   Social History   Tobacco Use  . Smoking status: Former Research scientist (life sciences)  . Smokeless tobacco: Never Used  Substance Use Topics  . Alcohol use: No    Alcohol/week: 0.0 standard drinks     Current Meds  Medication Sig  . labetalol (NORMODYNE) 200 MG tablet Take 1 tablet by mouth once daily  . NIFEdipine (ADALAT CC) 30 MG 24 hr tablet Take 1 tablet by mouth once daily    ROS:  Review of Systems    Constitutional: Negative for fever.  Eyes: Negative for blurred vision and double vision.  Respiratory: Negative for cough, shortness of breath and wheezing.   Cardiovascular: Negative for chest pain and palpitations.  Musculoskeletal:       (+) Right heel pain  Neurological: Negative for dizziness and headaches.     Objective:   Today's Vitals: BP (!) 145/90 (BP Location: Left Arm, Patient Position: Sitting, Cuff Size: Normal)   Pulse 93   Temp 98.2 F (36.8 C) (Temporal)   Ht 5\' 2"  (1.575 m)   Wt 287 lb 9.6 oz (130.5 kg)   SpO2 97%   BMI 52.60 kg/m  Vitals with BMI 01/17/2020 10/18/2019 07/06/2017  Height 5\' 2"  5\' 2"  -  Weight 287 lbs 10 oz 281 lbs 10 oz -  BMI 123XX123 123XX123 -  Systolic [id number]  Diastolic 90 72 69  Pulse 93 88 79     Physical Exam Vitals reviewed.  Constitutional:      General: She is not in acute distress.    Appearance: Normal appearance. She is obese.  HENT:     Head: Normocephalic and atraumatic.  Neck:     Vascular: No carotid bruit.  Cardiovascular:     Rate and Rhythm: Normal rate and regular rhythm.  Pulses: Normal pulses.          Dorsalis pedis pulses are 2+ on the right side.     Heart sounds: Normal heart sounds.  Pulmonary:     Effort: Pulmonary effort is normal.     Breath sounds: Normal breath sounds.  Musculoskeletal:     Right foot: Normal range of motion. No deformity.       Feet:  Feet:     Right foot:     Skin integrity: Skin integrity normal.     Toenail Condition: Right toenails are normal.  Skin:    General: Skin is warm and dry.  Neurological:     General: No focal deficit present.     Mental Status: She is alert and oriented to person, place, and time.  Psychiatric:        Mood and Affect: Mood normal.        Behavior: Behavior normal.        Judgment: Judgment normal.          Assessment and Plan   1. Pain of right heel   2. Benign essential hypertension      Plan: 1.  For now I  recommended that she rest her foot, consider using ice as needed, consider using a ankle/heel brace/pressure relieving splint that she can get at a medical equipment store.  I also recommended that she continue to use Tylenol as well as ibuprofen as needed.  I will refer her to podiatry for further evaluation and management.  I think she most likely has plantar fasciitis, and I did tell her that this is my suspicion.  I told her that it can take 6 to 12 months for the pain to resolve without surgery using supportive measures.  She tells me she is aware of this and would like to be seen by a specialist, thus referral will be made.  2.  She has not taken her blood pressure medication today.  Blood pressure is a bit elevated, however I recommended that she continue on her current regimen and take her blood pressure medications as soon as she leaves the office today.   Tests ordered Orders Placed This Encounter  Procedures  . Ambulatory referral to [facility] ordered this encounter  Medications  . ibuprofen (ADVIL) 600 MG tablet    Sig: Take 1 tablet (600 mg total) by mouth every 8 (eight) hours as needed.    Dispense:  30 tablet    Refill:  0    Order Specific Question:   Supervising Provider    Answer:   Doree Albee [id number]    Patient to follow-up in 3 months with Dr. Anastasio Champion for her annual physical exam. Ailene Ards, NP

## 2020-03-27 ENCOUNTER — Other Ambulatory Visit (INDEPENDENT_AMBULATORY_CARE_PROVIDER_SITE_OTHER): Payer: Self-pay | Admitting: Nurse Practitioner

## 2020-03-27 DIAGNOSIS — I10 Essential (primary) hypertension: Secondary | ICD-10-CM

## 2020-03-28 ENCOUNTER — Telehealth: Payer: 59 | Admitting: Emergency Medicine

## 2020-03-28 DIAGNOSIS — J069 Acute upper respiratory infection, unspecified: Secondary | ICD-10-CM | POA: Diagnosis not present

## 2020-03-28 MED ORDER — BENZONATATE 100 MG PO CAPS: 100.0000 mg | ORAL_CAPSULE | Freq: Two times a day (BID) | ORAL | 0 refills | Status: DC | PRN

## 2020-03-28 MED ORDER — FLUTICASONE PROPIONATE 50 MCG/ACT NA SUSP: 2.0000 | Freq: Every day | NASAL | 0 refills | Status: DC

## 2020-03-28 NOTE — Progress Notes (Signed)

## 2020-03-31 ENCOUNTER — Telehealth: Payer: 59 | Admitting: Family

## 2020-03-31 DIAGNOSIS — B9689 Other specified bacterial agents as the cause of diseases classified elsewhere: Secondary | ICD-10-CM

## 2020-03-31 DIAGNOSIS — J069 Acute upper respiratory infection, unspecified: Secondary | ICD-10-CM

## 2020-03-31 MED ORDER — DOXYCYCLINE HYCLATE 100 MG PO TABS: 100.0000 mg | ORAL_TABLET | Freq: Two times a day (BID) | ORAL | 0 refills | Status: DC

## 2020-03-31 MED ORDER — PREDNISONE 20 MG PO TABS: 40.0000 mg | ORAL_TABLET | Freq: Every day | ORAL | 0 refills | Status: DC

## 2020-03-31 NOTE — Progress Notes (Signed)
We are sorry you are not feeling well.  Here is how we plan to help!  Based on what you have shared with me, it looks like you may have a viral upper respiratory infection.  Upper respiratory infections are caused by a large number of viruses; however, rhinovirus is the most common cause.   Symptoms vary from person to person, with common symptoms including sore throat, cough, fatigue or lack of energy and feeling of general discomfort.  A low-grade fever of up to 100.4 may present, but is often uncommon.  Symptoms vary however, and are closely related to a person's age or underlying illnesses.  The most common symptoms associated with an upper respiratory infection are nasal discharge or congestion, cough, sneezing, headache and pressure in the ears and face.  These symptoms usually persist for about 3 to 10 days, but can last up to 2 weeks.  It is important to know that upper respiratory infections do not cause serious illness or complications in most cases.    Upper respiratory infections can be transmitted from person to person, with the most common method of transmission being a person's hands.  The virus is able to live on the skin and can infect other persons for up to 2 hours after direct contact.  Also, these can be transmitted when someone coughs or sneezes; thus, it is important to cover the mouth to reduce this risk.  To keep the spread of the illness at [place], good hand hygiene is very important.  This is an infection that is most likely caused by a virus. There are no specific treatments other than to help you with the symptoms until the infection runs its course.  We are sorry you are not feeling well.  Here is how we plan to help!   For nasal congestion, you may use an oral decongestants such as Mucinex D or if you have glaucoma or high blood pressure use plain Mucinex.  Saline nasal spray or nasal drops can help and can safely be used as often as needed for congestion.    If you do not  have a history of heart disease, hypertension, diabetes or thyroid disease, prostate/bladder issues or glaucoma, you may also use Sudafed to treat nasal congestion.  It is highly recommended that you consult with a pharmacist or your primary care physician to ensure this medication is safe for you to take.     If you have a cough, you may use cough suppressants such as Delsym and Robitussin.  If you have glaucoma or high blood pressure, you can also use Coricidin HBP.   I have prescribed for you Doxycycline 100 mg twice a day x 10 days and Prednisone 20 mg once daily x 5 days  If you have a sore or scratchy throat, use a saltwater gargle-  to  teaspoon of salt dissolved in a 4-ounce to 8-ounce glass of warm water.  Gargle the solution for approximately 15-30 seconds and then spit.  It is important not to swallow the solution.  You can also use throat lozenges/cough drops and Chloraseptic spray to help with throat pain or discomfort.  Warm or cold liquids can also be helpful in relieving throat pain.  For headache, pain or general discomfort, you can use Ibuprofen or Tylenol as directed.   Some authorities believe that zinc sprays or the use of Echinacea may shorten the course of your symptoms.   HOME CARE . Only take medications as instructed by your medical  team. . Be sure to drink plenty of fluids. Water is fine as well as fruit juices, sodas and electrolyte beverages. You may want to stay away from caffeine or alcohol. If you are nauseated, try taking small sips of liquids. How do you know if you are getting enough fluid? Your urine should be a pale yellow or almost colorless. . Get rest. . Taking a steamy shower or using a humidifier may help nasal congestion and ease sore throat pain. You can place a towel over your head and breathe in the steam from hot water coming from a faucet. . Using a saline nasal spray works much the same way. . Cough drops, hard candies and sore throat lozenges may  ease your cough. . Avoid close contacts especially the very young and the elderly . Cover your mouth if you cough or sneeze . Always remember to wash your hands.   GET HELP RIGHT AWAY IF: . You develop worsening fever. . If your symptoms do not improve within 10 days . You develop yellow or green discharge from your nose over 3 days. . You have coughing fits . You develop a severe head ache or visual changes. . You develop shortness of breath, difficulty breathing or start having chest pain . Your symptoms persist after you have completed your treatment plan  MAKE SURE YOU   Understand these instructions.  Will watch your condition.  Will get help right away if you are not doing well or get worse.  Your e-visit answers were reviewed by a board certified advanced clinical practitioner to complete your personal care plan. Depending upon the condition, your plan could have included both over the counter or prescription medications. Please review your pharmacy choice. If there is a problem, you may call our nursing hot line at and have the prescription routed to another pharmacy. Your safety is important to [place]. If you have drug allergies check your prescription carefully.   You can use MyChart to ask questions about today's visit, request a non-urgent call back, or ask for a work or school excuse for 24 hours related to this e-Visit. If it has been greater than 24 hours you will need to follow up with your provider, or enter a new e-Visit to address those concerns. You will get an e-mail in the next two days asking about your experience.  I hope that your e-visit has been valuable and will speed your recovery. Thank you for using e-visits.     Greater than 5 minutes, yet less than 10 minutes of time have been spent researching, coordinating, and implementing care for this patient today.  Thank you for the details you included in the comment boxes. Those details are very helpful in  determining the best course of treatment for you and help [place] to provide the best care.

## 2020-04-11 ENCOUNTER — Other Ambulatory Visit (HOSPITAL_COMMUNITY): Payer: Self-pay | Admitting: Nurse Practitioner

## 2020-04-20 ENCOUNTER — Other Ambulatory Visit: Payer: Self-pay

## 2020-04-20 ENCOUNTER — Ambulatory Visit (INDEPENDENT_AMBULATORY_CARE_PROVIDER_SITE_OTHER): Payer: 59 | Admitting: Internal Medicine

## 2020-04-20 ENCOUNTER — Encounter (INDEPENDENT_AMBULATORY_CARE_PROVIDER_SITE_OTHER): Payer: Self-pay | Admitting: Internal Medicine

## 2020-04-20 VITALS — BP 136/80 | HR 119 | Temp 97.9°F | Ht 62.0 in | Wt 281.6 lb

## 2020-04-20 DIAGNOSIS — Z0001 Encounter for general adult medical examination with abnormal findings: Secondary | ICD-10-CM

## 2020-04-20 DIAGNOSIS — I10 Essential (primary) hypertension: Secondary | ICD-10-CM | POA: Diagnosis not present

## 2020-04-20 DIAGNOSIS — Z131 Encounter for screening for diabetes mellitus: Secondary | ICD-10-CM | POA: Diagnosis not present

## 2020-04-20 DIAGNOSIS — E559 Vitamin D deficiency, unspecified: Secondary | ICD-10-CM

## 2020-04-20 NOTE — Progress Notes (Signed)
Chief Complaint: This 39 year old lady comes in for an annual physical exam and to address her chronic conditions which are described below. HPI: Her main issue has been morbid obesity and she has not made any significant progress over the years. She also has hypertension which started in pregnancy and has continued. She has a history of PCOS also. She denies any chest pain, dyspnea, palpitations or limb weakness. She did describe episodes of lightheadedness on standing up and I think she may well have been somewhat dehydrated.  Past Medical History:  Diagnosis Date  . Anemia   . Hypertension   . PCOS (polycystic ovarian syndrome)    Past Surgical History:  Procedure Laterality Date  . CERVICAL CERCLAGE N/A 03/10/2017   Procedure: Emergency CERVICAL CERCLAGE, Excision Vaginal Septum;  Surgeon: Azucena Fallen, MD;  Location: [facility];  Service: Gynecology;  Laterality: N/A;  EDD: 07/17/17 Allergy: Sulfa, Demerol  . lt eye surgery    . OVARIAN CYST REMOVAL     1996  . 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    Social History   Tobacco Use  . Smoking status: Former Research scientist (life sciences)  . Smokeless tobacco: Never Used  Substance Use Topics  . Alcohol use: No    Alcohol/week: 0.0 standard drinks      Allergies:  Allergies  Allergen Reactions  . Demerol [Meperidine] Nausea And Vomiting and Other (See Comments)    Reaction:  High fever  . Sulfa Antibiotics Hives     Current Meds  Medication Sig  . fluticasone (FLONASE) 50 MCG/ACT nasal spray Place 2 sprays into both nostrils daily.  Marland Kitchen ibuprofen (ADVIL) 600 MG tablet Take 1 tablet (600 mg total) by mouth every 8 (eight) hours as needed.  . labetalol (NORMODYNE) 200 MG tablet Take 1 tablet by mouth once daily  . NIFEdipine (ADALAT CC) 30 MG 24 hr tablet Take 1 tablet by mouth once daily  . [DISCONTINUED] benzonatate (TESSALON)  100 MG capsule Take 1 capsule (100 mg total) by mouth 2 (two) times daily as needed for cough.  . [DISCONTINUED] doxycycline (VIBRA-TABS) 100 MG tablet Take 1 tablet (100 mg total) by mouth 2 (two) times daily.  . [DISCONTINUED] predniSONE (DELTASONE) 20 MG tablet Take 2 tablets (40 mg total) by mouth daily with breakfast.      Depression screen [facility] 2/9 01/17/2020  Decreased Interest 0  Down, Depressed, Hopeless 0  PHQ - 2 Score 0     [id number] from the symptoms mentioned above,there are no other symptoms referable to all systems reviewed.       Physical Exam: Blood pressure 136/80, pulse (!) 119, temperature 97.9 F (36.6 C), temperature source Temporal, height 5\' 2"  (1.575 m), weight 281 lb 9.6 oz (127.7 kg), SpO2 96 %, unknown if currently breastfeeding. Vitals with BMI 04/20/2020 01/17/2020 10/18/2019  Height 5\' 2"  5\' 2"  5\' 2"   Weight 281 lbs 10 oz 287 lbs 10 oz 281 lbs 10 oz  BMI 51.49 44.03 47.42  Systolic 595 638 756  Diastolic 80 90 72  Pulse 433 93 88      She looks systemically well but remains morbidly obese.  Blood pressure is acceptable at the present time. General: Alert, cooperative, and appears to be the stated age.No pallor.  No jaundice.  No clubbing. Head: Normocephalic Eyes: Sclera white, pupils equal and reactive to light, red reflex x 2,  Ears: Normal bilaterally Oral cavity: Lips, mucosa, and tongue normal: Teeth and gums normal Neck: No adenopathy, supple, symmetrical, trachea midline, and thyroid does not appear enlarged Respiratory: Clear to auscultation bilaterally.No wheezing, crackles or bronchial breathing. Cardiovascular: Heart sounds are present and appear to be normal without murmurs or added sounds.  No carotid bruits.  Peripheral pulses are present and equal bilaterally.: Gastrointestinal:positive bowel sounds, no hepatosplenomegaly.  No masses felt.No tenderness. Skin: Clear, No rashes noted.No worrisome skin lesions seen. Neurological:  Grossly intact without focal findings, cranial nerves II through XII intact, muscle strength equal bilaterally Musculoskeletal: No acute joint abnormalities noted.Full range of movement noted with joints. Psychiatric: Affect appropriate, non-anxious.    Assessment  1. Encounter for general adult medical examination with abnormal findings   2. Benign essential hypertension   3. Morbid obesity ([place])   4. Vitamin D deficiency disease   5. Screening for diabetes mellitus     Tests Ordered:   Orders Placed This Encounter  Procedures  . CBC  . COMPLETE METABOLIC PANEL WITH GFR  . Hemoglobin A1c  . Lipid panel  . T3, free  . T4  . TSH     Plan  1. Blood work is ordered. 2. She will continue with labetalol and nifedipine for hypertension which seems to be controlling her blood pressure reasonably well. 3. We discussed nutrition today especially as she needs to lose weight and the concept of intermittent fasting combined with a plant-based diet was introduced again.  I told her she must drink at least 1 gallon of water every day. 4. Further recommendations will depend on blood results and I will see her for follow-up in 3 months time. 5. Today, in addition to a preventative visit, I performed an office visit independently to address her hypertension and obesity.     No orders of the defined types were placed in this encounter.    Thailan Sava C Ahava Kissoon   04/20/2020, 1:34 PM

## 2020-04-21 LAB — COMPLETE METABOLIC PANEL WITH GFR
AG Ratio: 1.8 (calc) (ref 1.0–2.5)
ALT: 23 U/L (ref 6–29)
AST: 13 U/L (ref 10–30)
Albumin: 4.4 g/dL (ref 3.6–5.1)
Alkaline phosphatase (APISO): 93 U/L (ref 31–125)
BUN: 12 mg/dL (ref 7–25)
CO2: 23 mmol/L (ref 20–32)
Calcium: 9.4 mg/dL (ref 8.6–10.2)
Chloride: 104 mmol/L (ref 98–110)
Creat: 0.85 mg/dL (ref 0.50–1.10)
GFR, Est African American: 99 mL/min/{1.73_m2} (ref >=60)
GFR, Est Non African American: 85 mL/min/{1.73_m2} (ref >=60)
Globulin: 2.5 g/dL (calc) (ref 1.9–3.7)
Glucose, Bld: 99 mg/dL (ref 65–99)
Potassium: 4.2 mmol/L (ref 3.5–5.3)
Sodium: 138 mmol/L (ref 135–146)
Total Bilirubin: 0.4 mg/dL (ref 0.2–1.2)
Total Protein: 6.9 g/dL (ref 6.1–8.1)

## 2020-04-21 LAB — CBC
HCT: 34.9 % — ABNORMAL LOW (ref 35.0–45.0)
Hemoglobin: 10.4 g/dL — ABNORMAL LOW (ref 11.7–15.5)
MCH: 21.1 pg — ABNORMAL LOW (ref 27.0–33.0)
MCHC: 29.8 g/dL — ABNORMAL LOW (ref 32.0–36.0)
MCV: 70.6 fL — ABNORMAL LOW (ref 80.0–100.0)
MPV: 9.7 fL (ref 7.5–12.5)
Platelets: 463 10*3/uL — ABNORMAL HIGH (ref 140–400)
RBC: 4.94 10*6/uL (ref 3.80–5.10)
RDW: 16.1 % — ABNORMAL HIGH (ref 11.0–15.0)
WBC: 11.9 10*3/uL — ABNORMAL HIGH (ref 3.8–10.8)

## 2020-04-21 LAB — T3, FREE: T3, Free: 3.4 pg/mL (ref 2.3–4.2)

## 2020-04-21 LAB — HEMOGLOBIN A1C
Hgb A1c MFr Bld: 5.6 % of total Hgb (ref <5.7)
Mean Plasma Glucose: 114 (calc)
eAG (mmol/L): 6.3 (calc)

## 2020-04-21 LAB — LIPID PANEL
Cholesterol: 182 mg/dL (ref <200)
HDL: 36 mg/dL — ABNORMAL LOW (ref >=50)
LDL Cholesterol (Calc): 120 mg/dL (calc) — ABNORMAL HIGH
Non-HDL Cholesterol (Calc): 146 mg/dL (calc) — ABNORMAL HIGH (ref <130)
Total CHOL/HDL Ratio: 5.1 (calc) — ABNORMAL HIGH (ref <5.0)
Triglycerides: 144 mg/dL (ref <150)

## 2020-04-21 LAB — T4: T4, Total: 9.2 ug/dL (ref 5.1–11.9)

## 2020-04-21 LAB — TSH: TSH: 0.71 mIU/L

## 2020-04-25 ENCOUNTER — Encounter (INDEPENDENT_AMBULATORY_CARE_PROVIDER_SITE_OTHER): Payer: Self-pay | Admitting: Internal Medicine

## 2020-04-25 NOTE — Telephone Encounter (Signed)
Please advise 

## 2020-07-27 ENCOUNTER — Ambulatory Visit (INDEPENDENT_AMBULATORY_CARE_PROVIDER_SITE_OTHER): Payer: 59 | Admitting: Internal Medicine

## 2020-08-06 ENCOUNTER — Other Ambulatory Visit (INDEPENDENT_AMBULATORY_CARE_PROVIDER_SITE_OTHER): Payer: Self-pay | Admitting: Nurse Practitioner

## 2020-08-06 DIAGNOSIS — I10 Essential (primary) hypertension: Secondary | ICD-10-CM

## 2020-08-26 ENCOUNTER — Telehealth: Payer: 59 | Admitting: Family

## 2020-08-26 ENCOUNTER — Encounter (INDEPENDENT_AMBULATORY_CARE_PROVIDER_SITE_OTHER): Payer: Self-pay | Admitting: Internal Medicine

## 2020-08-26 DIAGNOSIS — J069 Acute upper respiratory infection, unspecified: Secondary | ICD-10-CM | POA: Diagnosis not present

## 2020-08-26 MED ORDER — BENZONATATE 100 MG PO CAPS: 100.0000 mg | ORAL_CAPSULE | Freq: Three times a day (TID) | ORAL | 0 refills | Status: DC | PRN

## 2020-08-26 MED ORDER — FLUTICASONE PROPIONATE 50 MCG/ACT NA SUSP: 2.0000 | Freq: Every day | NASAL | 6 refills | Status: DC

## 2020-08-26 NOTE — Progress Notes (Signed)
We are sorry you are not feeling well.  Here is how we plan to help!  Based on what you have shared with me, it looks like you may have a viral upper respiratory infection.  Upper respiratory infections are caused by a large number of viruses; however, rhinovirus is the most common cause.   Given your symptoms, you also need to be COVID tested to rule out.   Symptoms vary from person to person, with common symptoms including sore throat, cough, fatigue or lack of energy and feeling of general discomfort.  A low-grade fever of up to 100.4 may present, but is often uncommon.  Symptoms vary however, and are closely related to a person's age or underlying illnesses.  The most common symptoms associated with an upper respiratory infection are nasal discharge or congestion, cough, sneezing, headache and pressure in the ears and face.  These symptoms usually persist for about 3 to 10 days, but can last up to 2 weeks.  It is important to know that upper respiratory infections do not cause serious illness or complications in most cases.    Upper respiratory infections can be transmitted from person to person, with the most common method of transmission being a person's hands.  The virus is able to live on the skin and can infect other persons for up to 2 hours after direct contact.  Also, these can be transmitted when someone coughs or sneezes; thus, it is important to cover the mouth to reduce this risk.  To keep the spread of the illness at [place], good hand hygiene is very important.  This is an infection that is most likely caused by a virus. There are no specific treatments other than to help you with the symptoms until the infection runs its course.  We are sorry you are not feeling well.  Here is how we plan to help!   For nasal congestion, you may use an oral decongestants such as Mucinex D or if you have glaucoma or high blood pressure use plain Mucinex.  Saline nasal spray or nasal drops can help and can  safely be used as often as needed for congestion.  For your congestion, I have prescribed Fluticasone nasal spray one spray in each nostril twice a day  If you do not have a history of heart disease, hypertension, diabetes or thyroid disease, prostate/bladder issues or glaucoma, you may also use Sudafed to treat nasal congestion.  It is highly recommended that you consult with a pharmacist or your primary care physician to ensure this medication is safe for you to take.     If you have a cough, you may use cough suppressants such as Delsym and Robitussin.  If you have glaucoma or high blood pressure, you can also use Coricidin HBP.   For cough I have prescribed for you A prescription cough medication called Tessalon Perles 100 mg. You may take 1-2 capsules every 8 hours as needed for cough  If you have a sore or scratchy throat, use a saltwater gargle-  to  teaspoon of salt dissolved in a 4-ounce to 8-ounce glass of warm water.  Gargle the solution for approximately 15-30 seconds and then spit.  It is important not to swallow the solution.  You can also use throat lozenges/cough drops and Chloraseptic spray to help with throat pain or discomfort.  Warm or cold liquids can also be helpful in relieving throat pain.  For headache, pain or general discomfort, you can use Ibuprofen or  Tylenol as directed.   Some authorities believe that zinc sprays or the use of Echinacea may shorten the course of your symptoms.   HOME CARE . Only take medications as instructed by your medical team. . Be sure to drink plenty of fluids. Water is fine as well as fruit juices, sodas and electrolyte beverages. You may want to stay away from caffeine or alcohol. If you are nauseated, try taking small sips of liquids. How do you know if you are getting enough fluid? Your urine should be a pale yellow or almost colorless. . Get rest. . Taking a steamy shower or using a humidifier may help nasal congestion and ease sore throat  pain. You can place a towel over your head and breathe in the steam from hot water coming from a faucet. . Using a saline nasal spray works much the same way. . Cough drops, hard candies and sore throat lozenges may ease your cough. . Avoid close contacts especially the very young and the elderly . Cover your mouth if you cough or sneeze . Always remember to wash your hands.   GET HELP RIGHT AWAY IF: . You develop worsening fever. . If your symptoms do not improve within 10 days . You develop yellow or green discharge from your nose over 3 days. . You have coughing fits . You develop a severe head ache or visual changes. . You develop shortness of breath, difficulty breathing or start having chest pain . Your symptoms persist after you have completed your treatment plan  MAKE SURE YOU   Understand these instructions.  Will watch your condition.  Will get help right away if you are not doing well or get worse.  Your e-visit answers were reviewed by a board certified advanced clinical practitioner to complete your personal care plan. Depending upon the condition, your plan could have included both over the counter or prescription medications. Please review your pharmacy choice. If there is a problem, you may call our nursing hot line at and have the prescription routed to another pharmacy. Your safety is important to [place]. If you have drug allergies check your prescription carefully.   You can use MyChart to ask questions about today's visit, request a non-urgent call back, or ask for a work or school excuse for 24 hours related to this e-Visit. If it has been greater than 24 hours you will need to follow up with your provider, or enter a new e-Visit to address those concerns. You will get an e-mail in the next two days asking about your experience.  I hope that your e-visit has been valuable and will speed your recovery. Thank you for using e-visits.  Approximately 5 minutes was spent  documenting and reviewing patient's chart.

## 2020-09-25 ENCOUNTER — Encounter (INDEPENDENT_AMBULATORY_CARE_PROVIDER_SITE_OTHER): Payer: Self-pay

## 2020-09-25 ENCOUNTER — Other Ambulatory Visit: Payer: Self-pay

## 2020-09-25 ENCOUNTER — Telehealth (INDEPENDENT_AMBULATORY_CARE_PROVIDER_SITE_OTHER): Payer: 59 | Admitting: Nurse Practitioner

## 2020-09-25 ENCOUNTER — Encounter (INDEPENDENT_AMBULATORY_CARE_PROVIDER_SITE_OTHER): Payer: Self-pay | Admitting: Nurse Practitioner

## 2020-09-25 ENCOUNTER — Telehealth (INDEPENDENT_AMBULATORY_CARE_PROVIDER_SITE_OTHER): Payer: Self-pay | Admitting: Nurse Practitioner

## 2020-09-25 NOTE — Telephone Encounter (Signed)
This patient contacted me on 09/25/20 and asked that her and her husband have their follow-up appointments rescheduled.  Will you call her sometime this week to schedule her and her husband Tomasita Crumble for a follow-up appointment?  They prefer to be seen on the same day.  I will also send a message in her husband's chart.  Thank you.

## 2020-10-09 NOTE — Telephone Encounter (Signed)
Scheduled her and husband for March 3rd at 5:00 & 5:30

## 2020-11-09 ENCOUNTER — Ambulatory Visit (INDEPENDENT_AMBULATORY_CARE_PROVIDER_SITE_OTHER): Payer: 59 | Admitting: Nurse Practitioner

## 2020-11-30 ENCOUNTER — Telehealth (INDEPENDENT_AMBULATORY_CARE_PROVIDER_SITE_OTHER): Payer: 59 | Admitting: Nurse Practitioner

## 2020-11-30 ENCOUNTER — Encounter (INDEPENDENT_AMBULATORY_CARE_PROVIDER_SITE_OTHER): Payer: Self-pay | Admitting: Nurse Practitioner

## 2020-11-30 VITALS — Temp 98.7°F | Ht 62.0 in

## 2020-11-30 DIAGNOSIS — R059 Cough, unspecified: Secondary | ICD-10-CM

## 2020-11-30 MED ORDER — ALBUTEROL SULFATE HFA 108 (90 BASE) MCG/ACT IN AERS: 2.0000 | INHALATION_SPRAY | Freq: Four times a day (QID) | RESPIRATORY_TRACT | 0 refills | Status: DC | PRN

## 2020-11-30 MED ORDER — BENZONATATE 100 MG PO CAPS: 100.0000 mg | ORAL_CAPSULE | Freq: Two times a day (BID) | ORAL | 0 refills | Status: DC | PRN

## 2020-11-30 MED ORDER — AZITHROMYCIN 250 MG PO TABS: ORAL_TABLET | ORAL | 0 refills | Status: DC

## 2020-11-30 NOTE — Progress Notes (Signed)
An audio-only tele-health visit was conducted today. I connected with  Janet Deleon on 11/30/20 utilizing audio-only technology and verified that I am speaking with the correct person using two identifiers. The patient was located at their home, and I was located at the office of [facility] during the encounter. I discussed the limitations of evaluation and management by telemedicine. The patient expressed understanding and agreed to proceed.   Subjective:  Patient ID: Janet Deleon, female    DOB: Dec 30, 1980  Age: 40 y.o. MRN: [id number]  CC:  Chief Complaint  Patient presents with  . Cough    Coughing up brown mucous, hot and cold but no fever, sneezing, some congestion in head, congestion in chest, symptoms started Monday night, has not done a covid test and was sick a few weeks ago      HPI  This patient arrives today for the above.  She tells me that since Monday night which is approximately 3 days ago she has been experiencing coughing.  At first it was very mild but starting Tuesday she started experiencing increased frequency and coughing, shortness of breath, wheezing, and started producing a brownish sputum.  She denies any fevers or chills.  She denies feeling extremely short of breath unless she has a coughing spell.  When she has a coughing spell it takes her few minutes to catch her breath after the spell.  She was around her sister-in-law who reported experiencing allergy-like symptoms shortly after seeing them earlier this week.  Otherwise she has no known sick contacts.  She is vaccinated for COVID-19.  She has not taken a COVID-19 test thus far, but does have some at home test available to her.  Past Medical History:  Diagnosis Date  . Anemia   . Hypertension   . PCOS (polycystic ovarian syndrome)       Family History  Problem Relation Age of Onset  . Hypertension Father   . Heart attack Father   . Diabetes Father   . Liver cancer Paternal Grandmother    . 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   Social History   Tobacco Use  . Smoking status: Former Research scientist (life sciences)  . Smokeless tobacco: Never Used  Substance Use Topics  . Alcohol use: No    Alcohol/week: 0.0 standard drinks     Current Meds  Medication Sig  . albuterol (VENTOLIN HFA) 108 (90 Base) MCG/ACT inhaler Inhale 2 puffs into the lungs every 6 (six) hours as needed for wheezing or shortness of breath.  Marland Kitchen azithromycin (ZITHROMAX) 250 MG tablet Take 2 tablets by mouth once on day 1, then take 1 tablet by mouth every 24 hours on days 2 through 5.  . benzonatate (TESSALON) 100 MG capsule Take 1 capsule (100 mg total) by mouth 2 (two) times daily as needed for cough.  . fluticasone (FLONASE) 50 MCG/ACT nasal spray Place 2 sprays into both nostrils daily.  Marland Kitchen ibuprofen (ADVIL) 600 MG tablet Take 1 tablet (600 mg total) by mouth every 8 (eight) hours as needed.  . labetalol (NORMODYNE) 200 MG tablet Take 1 tablet by mouth once daily    ROS:  Review of Systems  Constitutional: Negative for chills and fever.  HENT: Negative for sore throat.   Respiratory: Positive for cough, sputum production, shortness of breath and wheezing.   Cardiovascular: Positive for orthopnea. Negative for  chest pain.  Musculoskeletal: Positive for back pain.  Neurological: Positive for dizziness and headaches.     Objective:   Today's Vitals: Temp 98.7 F (37.1 C) (Temporal)   Ht 5\' 2"  (1.575 m)   BMI 51.51 kg/m  Vitals with BMI 11/30/2020 04/20/2020 01/17/2020  Height 5\' 2"  5\' 2"  5\' 2"   Weight - 281 lbs 10 oz 287 lbs 10 oz  BMI - 10.17 51.02  Systolic - 585 277  Diastolic - 80 90  Pulse - 119 93     Physical Exam Comprehensive physical exam not completed today as office visit was conducted remotely.  Patient sounded fairly well over the phone, she did have to cough a couple  times but was able to speak in complete sentences without having to stop to breathe.  Patient was alert and oriented, and appeared to have appropriate judgment.       Assessment and Plan   1. Cough      Plan: 1.  I am concerned she may have COVID-19 infection.  I have encouraged her to get tested for COVID-19 and let me know through my chart if her test comes back positive.  Also concerned that she may have bronchitis versus pneumonia thus, I told her if her Covid test comes back negative she can take azithromycin that I will prescribe to her pharmacy, albuterol inhaler as needed, Tessalon Perles as needed, and over-the-counter Mucinex as needed.   Tests ordered No orders of the defined types were placed in this encounter.     Meds ordered this encounter  Medications  . albuterol (VENTOLIN HFA) 108 (90 Base) MCG/ACT inhaler    Sig: Inhale 2 puffs into the lungs every 6 (six) hours as needed for wheezing or shortness of breath.    Dispense:  8 g    Refill:  0    Order Specific Question:   Supervising Provider    Answer:   Anastasio Champion, NIMISH C [[id number]]  . benzonatate (TESSALON) 100 MG capsule    Sig: Take 1 capsule (100 mg total) by mouth 2 (two) times daily as needed for cough.    Dispense:  20 capsule    Refill:  0    Order Specific Question:   Supervising Provider    Answer:   Hurshel Party C [[id number]]  . azithromycin (ZITHROMAX) 250 MG tablet    Sig: Take 2 tablets by mouth once on day 1, then take 1 tablet by mouth every 24 hours on days 2 through 5.    Dispense:  6 tablet    Refill:  0    Order Specific Question:   Supervising Provider    Answer:   Doree Albee [[id number]]    Patient to follow-up as scheduled next week or sooner as needed.  Total time spent on telephone today was 9 minutes and 27 seconds.  Ailene Ards, NP

## 2020-11-30 NOTE — Patient Instructions (Addendum)
Test for Covid!  If positive notify me right away and you may start the following medicines that are being sent to your pharmacy: 1. Albuterol Inhaler: 1-2 puffs into the lungs every 6 hours as needed for wheezing 2. Benzonatate (Tessalon Perles): 100 mg capsule by mouth every 12 hours as needed for cough 3.  Over-the-counter Mucinex: Take as directed on package.  If negative start the above medications and the following antibiotic that is being sent to your pharmacy: 1. Take 2 tablets by mouth once on day 1, then take 1 tablet by mouth every 24 hours on days 2 through 5.

## 2020-12-04 ENCOUNTER — Encounter (INDEPENDENT_AMBULATORY_CARE_PROVIDER_SITE_OTHER): Payer: Self-pay | Admitting: Nurse Practitioner

## 2020-12-06 ENCOUNTER — Ambulatory Visit (INDEPENDENT_AMBULATORY_CARE_PROVIDER_SITE_OTHER): Payer: 59 | Admitting: Nurse Practitioner

## 2020-12-28 ENCOUNTER — Encounter (INDEPENDENT_AMBULATORY_CARE_PROVIDER_SITE_OTHER): Payer: Self-pay | Admitting: Nurse Practitioner

## 2020-12-28 ENCOUNTER — Ambulatory Visit (INDEPENDENT_AMBULATORY_CARE_PROVIDER_SITE_OTHER): Payer: 59 | Admitting: Nurse Practitioner

## 2020-12-28 ENCOUNTER — Telehealth (INDEPENDENT_AMBULATORY_CARE_PROVIDER_SITE_OTHER): Payer: Self-pay | Admitting: Nurse Practitioner

## 2020-12-28 ENCOUNTER — Other Ambulatory Visit: Payer: Self-pay

## 2020-12-28 VITALS — BP 110/81 | HR 89 | Temp 97.9°F | Resp 18 | Ht 62.0 in | Wt 298.2 lb

## 2020-12-28 DIAGNOSIS — B356 Tinea cruris: Secondary | ICD-10-CM | POA: Diagnosis not present

## 2020-12-28 DIAGNOSIS — K219 Gastro-esophageal reflux disease without esophagitis: Secondary | ICD-10-CM | POA: Diagnosis not present

## 2020-12-28 DIAGNOSIS — J309 Allergic rhinitis, unspecified: Secondary | ICD-10-CM

## 2020-12-28 DIAGNOSIS — I10 Essential (primary) hypertension: Secondary | ICD-10-CM

## 2020-12-28 DIAGNOSIS — E559 Vitamin D deficiency, unspecified: Secondary | ICD-10-CM | POA: Diagnosis not present

## 2020-12-28 MED ORDER — CLOTRIMAZOLE-BETAMETHASONE 1-0.05 % EX CREA: 1.0000 "application " | TOPICAL_CREAM | Freq: Two times a day (BID) | CUTANEOUS | 0 refills | Status: DC

## 2020-12-28 MED ORDER — VITAMIN D3 25 MCG (1000 UT) PO CAPS: 1000.0000 [IU] | ORAL_CAPSULE | Freq: Every day | ORAL | 3 refills | Status: DC

## 2020-12-28 MED ORDER — PANTOPRAZOLE SODIUM 40 MG PO TBEC: 40.0000 mg | DELAYED_RELEASE_TABLET | Freq: Every day | ORAL | 1 refills | Status: DC

## 2020-12-28 MED ORDER — LEVOCETIRIZINE DIHYDROCHLORIDE 5 MG PO TABS: 5.0000 mg | ORAL_TABLET | Freq: Every evening | ORAL | 3 refills | Status: DC

## 2020-12-28 NOTE — Progress Notes (Signed)
Subjective:  Patient ID: Janet Deleon, female    DOB: 1981/01/13  Age: 40 y.o. MRN: [id number]  CC:  Chief Complaint  Patient presents with  . Hypertension  . Obesity  . Rash  . Other    Vitamin D deficiency  . Gastroesophageal Reflux  . Allergic Rhinitis       HPI  This patient arrives today for the above.  Hypertension: She continues on labetalol and nifedipine and is tolerating these well.  Obesity: She is a bit discouraged because she tells me she continues to gain weight and has been trying to lose weight but has been unsuccessful.  She has tried intermittent fasting in the past but tells me she cannot really tolerate the fasting.  She would like to discuss other options for helping her with weight loss.  She does report being on phentermine in the past with good results.  Rash: She indicates her rash that is erupted on her left hand.  It is a bit dry and scaly and has been there for couple of months.  She has not really tried anything to treat the rash.  Does not seem to bother her as far as being painful.  Vitamin D deficiency: Last serum check showed a level of 9.  She was told to start vitamin D3 supplement, but has not been taking it.  She tells me if she has this prescribed to the pharmacy her insurance will help cover this so she would like this prescription sent to her pharmacy if possible.  GERD: She reports feeling heartburn with any kind of food or water intake.  Allergic rhinitis: She also experiences nasal congestion and would like to have a allergy pill ordered and sent to her pharmacy.  Past Medical History:  Diagnosis Date  . Anemia   . Hypertension   . PCOS (polycystic ovarian syndrome)       Family History  Problem Relation Age of Onset  . Hypertension Father   . Heart attack Father   . Diabetes Father   . Liver cancer Paternal Grandmother   . 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   Social History   Tobacco Use  . Smoking status: Former Research scientist (life sciences)  . Smokeless tobacco: Never Used  Substance Use Topics  . Alcohol use: No    Alcohol/week: 0.0 standard drinks     Current Meds  Medication Sig  . albuterol (VENTOLIN HFA) 108 (90 Base) MCG/ACT inhaler Inhale 2 puffs into the lungs every 6 (six) hours as needed for wheezing or shortness of breath.  Marland Kitchen azithromycin (ZITHROMAX) 250 MG tablet Take 2 tablets by mouth once on day 1, then take 1 tablet by mouth every 24 hours on days 2 through 5.  . benzonatate (TESSALON) 100 MG capsule Take 1 capsule (100 mg total) by mouth 2 (two) times daily as needed for cough.  . Cholecalciferol (VITAMIN D3) 25 MCG (1000 UT) CAPS Take 1 capsule (1,000 Units total) by mouth daily.  . clotrimazole-betamethasone (LOTRISONE) cream Apply 1 application topically 2 (two) times daily.  . fluticasone (FLONASE) 50 MCG/ACT nasal spray Place 2 sprays into both nostrils daily.  Marland Kitchen ibuprofen (ADVIL) 600 MG tablet Take 1 tablet (600 mg total) by mouth every 8 (eight) hours as needed.  . labetalol (NORMODYNE) 200 MG tablet Take 1 tablet by mouth once daily  . levocetirizine (  XYZAL) 5 MG tablet Take 1 tablet (5 mg total) by mouth every evening.  . pantoprazole (PROTONIX) 40 MG tablet Take 1 tablet (40 mg total) by mouth daily.    ROS:  Review of Systems  Constitutional: Positive for malaise/fatigue.  HENT: Positive for congestion.   Respiratory: Positive for shortness of breath.   Cardiovascular: Negative for chest pain.  Skin: Positive for rash.     Objective:   Today's Vitals: BP 110/81 (BP Location: Left Arm, Patient Position: Sitting, Cuff Size: Large)   Pulse 89   Temp 97.9 F (36.6 C) (Temporal)   Resp 18   Ht 5\' 2"  (1.575 m)   Wt 298 lb 3.2 oz (135.3 kg)   SpO2 99%   BMI 54.54 kg/m  Vitals with BMI 12/28/2020 11/30/2020 04/20/2020  Height 5\' 2"  5\' 2"  5\' 2"   Weight 298  lbs 3 oz - 281 lbs 10 oz  BMI 69.67 - 89.38  Systolic 101 - 751  Diastolic 81 - 80  Pulse 89 - 119     Physical Exam Vitals reviewed.  Constitutional:      General: She is not in acute distress.    Appearance: Normal appearance.  HENT:     Head: Normocephalic and atraumatic.  Neck:     Vascular: No carotid bruit.  Cardiovascular:     Rate and Rhythm: Normal rate and regular rhythm.     Pulses: Normal pulses.     Heart sounds: Normal heart sounds.  Pulmonary:     Effort: Pulmonary effort is normal.     Breath sounds: Normal breath sounds.  Skin:    General: Skin is warm and dry.       Neurological:     General: No focal deficit present.     Mental Status: She is alert and oriented to person, place, and time.  Psychiatric:        Mood and Affect: Mood normal.        Behavior: Behavior normal.        Judgment: Judgment normal.          Assessment and Plan   1. Vitamin D deficiency disease   2. Gastroesophageal reflux disease, unspecified whether esophagitis present   3. Allergic rhinitis, unspecified seasonality, unspecified trigger   4. Tinea cruris   5. Benign essential hypertension   6. Morbid obesity ([place])      Plan: 1.  We will prescribe vitamin D supplement to her pharmacy and she will start taking this. 2.  Will trial her on pantoprazole daily. 3.  We will start her on Xyzal daily. 4.  I think her rash represents tinea cruris so will order Lotrisone cream that she can apply twice a day for 1 week. 5.  Blood pressure at goal on current regimen she will continue taking her medications as prescribed. 6.  We will refer her to the healthy weight and wellness clinic per her request to help with weight loss.   Tests ordered No orders of the defined types were placed in this encounter.     Meds ordered this encounter  Medications  . Cholecalciferol (VITAMIN D3) 25 MCG (1000 UT) CAPS    Sig: Take 1 capsule (1,000 Units total) by mouth daily.     Dispense:  90 capsule    Refill:  3    Order Specific Question:   Supervising Provider    Answer:   Hurshel Party C [[id number]]  . pantoprazole (PROTONIX) 40 MG tablet  Sig: Take 1 tablet (40 mg total) by mouth daily.    Dispense:  90 tablet    Refill:  1    Order Specific Question:   Supervising Provider    Answer:   Hurshel Party C [[id number]]  . levocetirizine (XYZAL) 5 MG tablet    Sig: Take 1 tablet (5 mg total) by mouth every evening.    Dispense:  90 tablet    Refill:  3    Order Specific Question:   Supervising Provider    Answer:   Hurshel Party C [[id number]]  . clotrimazole-betamethasone (LOTRISONE) cream    Sig: Apply 1 application topically 2 (two) times daily.    Dispense:  30 g    Refill:  0    Order Specific Question:   Supervising Provider    Answer:   Doree Albee [[id number]]    Patient to follow-up in 1 month at which point she will be due to have her vitamin D level checked.  Ailene Ards, NP

## 2020-12-28 NOTE — Telephone Encounter (Signed)
Patient requesting referral to the healthy weight and wellness clinic to discuss her assistance with managing her obesity.

## 2021-01-01 NOTE — Telephone Encounter (Signed)
Dr Darnell Level is wellness; She is there only one and Dr Anastasio Champion will send to them as well.

## 2021-01-01 NOTE — Telephone Encounter (Signed)
Where you like it to go  ? Will send to new location in [place]?

## 2021-01-01 NOTE — Telephone Encounter (Signed)
If there is a location in [place] they would probably prefer that, if not then we will have to send to [place]. Thank you.

## 2021-01-02 NOTE — Telephone Encounter (Signed)
No, just sent ot what you ordered. So we can try this,. They been with Dr Darnell Level; but want to see if the y can do something else. Did not say they wanted to leave Dr Darnell Level plan just see what other options.

## 2021-01-02 NOTE — Telephone Encounter (Signed)
Do they want to stay with Dr. Anastasio Champion? If so, then will you see if they can change their appointments to one with Dr. Anastasio Champion. If they would prefer to try the other clinic let them know whether or not they will have to travel to [facility] for it.

## 2021-01-23 ENCOUNTER — Encounter (INDEPENDENT_AMBULATORY_CARE_PROVIDER_SITE_OTHER): Payer: Self-pay | Admitting: Nurse Practitioner

## 2021-01-31 ENCOUNTER — Other Ambulatory Visit (INDEPENDENT_AMBULATORY_CARE_PROVIDER_SITE_OTHER): Payer: Self-pay | Admitting: Nurse Practitioner

## 2021-01-31 DIAGNOSIS — I10 Essential (primary) hypertension: Secondary | ICD-10-CM

## 2021-02-07 ENCOUNTER — Telehealth (INDEPENDENT_AMBULATORY_CARE_PROVIDER_SITE_OTHER): Payer: 59 | Admitting: Nurse Practitioner

## 2021-02-07 ENCOUNTER — Other Ambulatory Visit (INDEPENDENT_AMBULATORY_CARE_PROVIDER_SITE_OTHER): Payer: Self-pay | Admitting: Nurse Practitioner

## 2021-02-07 ENCOUNTER — Encounter (INDEPENDENT_AMBULATORY_CARE_PROVIDER_SITE_OTHER): Payer: Self-pay | Admitting: Nurse Practitioner

## 2021-02-07 ENCOUNTER — Other Ambulatory Visit: Payer: Self-pay

## 2021-02-07 VITALS — BP 151/86 | Temp 97.8°F

## 2021-02-07 DIAGNOSIS — K219 Gastro-esophageal reflux disease without esophagitis: Secondary | ICD-10-CM | POA: Diagnosis not present

## 2021-02-07 DIAGNOSIS — E559 Vitamin D deficiency, unspecified: Secondary | ICD-10-CM

## 2021-02-07 DIAGNOSIS — I10 Essential (primary) hypertension: Secondary | ICD-10-CM

## 2021-02-07 DIAGNOSIS — U071 COVID-19: Secondary | ICD-10-CM

## 2021-02-07 MED ORDER — NIRMATRELVIR/RITONAVIR (PAXLOVID)TABLET: ORAL_TABLET | ORAL | 0 refills | Status: AC

## 2021-02-07 NOTE — Progress Notes (Signed)
Due to national recommendations of social distancing related to the [place] pandemic, an audio/visual tele-health visit was felt to be the most appropriate encounter type for this patient today. I connected with  Janet Deleon on 02/07/21 utilizing audio/visual technology and verified that I am speaking with the correct person using two identifiers. The patient was located at their home, and I was located at the office of [facility] during the encounter. I discussed the limitations of evaluation and management by telemedicine. The patient expressed understanding and agreed to proceed.      Subjective:  Patient ID: Janet Deleon, female    DOB: 31-Jul-1981  Age: 40 y.o. MRN: [id number]  CC:  Chief Complaint  Patient presents with  . Covid Exposure  . Gastroesophageal Reflux  . Obesity  . Hypertension  . Other    Vitamin D deficiency      HPI  This patient arrives today for the above.  COVID-19 infection: She took a COVID-19 at-home test today and it was positive.  She tells me her symptoms started yesterday.  Overall her symptoms appear to be mild but she does feel quite a bit of fatigue, cough, sinus congestion, nasal congestion, and headache.  She denies any fever.  She denies any significant shortness of breath.  She has had 3 COVID-19 vaccines.  GERD: At last office visit we started pantoprazole to see if this would help with her heartburn.  She tells me her heartburn is much improved on the pantoprazole.  Obesity: I had referred her to the healthy weight and wellness clinic but she has not heard back from them regarding getting an appointment scheduled.  Hypertension: She continues on labetalol nifedipine and is tolerating his medications well.  Vitamin D deficiency: Last vitamin D serum level was 9.  I recommend she start taking at least 1000 IUs of vitamin D3 daily.  She tells me she has made this change since I saw her.  Past Medical History:  Diagnosis Date  .  Anemia   . Hypertension   . PCOS (polycystic ovarian syndrome)       Family History  Problem Relation Age of Onset  . Hypertension Father   . Heart attack Father   . Diabetes Father   . Liver cancer Paternal Grandmother   . 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   Social History   Tobacco Use  . Smoking status: Former Research scientist (life sciences)  . Smokeless tobacco: Never Used  Substance Use Topics  . Alcohol use: No    Alcohol/week: 0.0 standard drinks     Current Meds  Medication Sig  . albuterol (VENTOLIN HFA) 108 (90 Base) MCG/ACT inhaler Inhale 2 puffs into the lungs every 6 (six) hours as needed for wheezing or shortness of breath.  Marland Kitchen azithromycin (ZITHROMAX) 250 MG tablet Take 2 tablets by mouth once on day 1, then take 1 tablet by mouth every 24 hours on days 2 through 5.  . benzonatate (TESSALON) 100 MG capsule Take 1 capsule (100 mg total) by mouth 2 (two) times daily as needed for cough.  . Cholecalciferol (VITAMIN D3) 25 MCG (1000 UT) CAPS Take 1 capsule (1,000 Units total) by mouth daily.  . clotrimazole-betamethasone (LOTRISONE) cream Apply 1 application topically 2 (two) times daily.  . fluticasone (FLONASE) 50 MCG/ACT nasal spray Place 2 sprays into both nostrils daily.  Marland Kitchen ibuprofen (ADVIL)  600 MG tablet Take 1 tablet (600 mg total) by mouth every 8 (eight) hours as needed.  . labetalol (NORMODYNE) 200 MG tablet Take 1 tablet by mouth once daily  . levocetirizine (XYZAL) 5 MG tablet Take 1 tablet (5 mg total) by mouth every evening.  . nirmatrelvir/ritonavir EUA (PAXLOVID) TABS Patient GFR is 85.  Take nirmatrelvir (150 mg) two tablets twice daily for 5 days and ritonavir (100 mg) one tablet twice daily for 5 days.  . pantoprazole (PROTONIX) 40 MG tablet Take 1 tablet (40 mg total) by mouth daily.    ROS:  Review of Systems  Constitutional: Positive  for malaise/fatigue. Negative for chills and fever.  HENT: Positive for congestion.   Respiratory: Positive for cough. Negative for shortness of breath.   Cardiovascular: Negative for chest pain.  Gastrointestinal: Negative for heartburn.  Skin: Negative for rash.  Neurological: Positive for headaches. Negative for dizziness.     Objective:   Today's Vitals: BP (!) 151/86   Temp 97.8 F (36.6 C)  Vitals with BMI 02/07/2021 12/28/2020 11/30/2020  Height - 5\' 2"  5\' 2"   Weight - 298 lbs 3 oz -  BMI - 29.47 -  Systolic 654 650 -  Diastolic 86 81 -  Pulse - 89 -     Physical Exam Comprehensive physical exam not completed today as office visit was conducted remotely.  Patient looked pretty well over the phone.  She did not appear to be in respiratory distress, she did cough a couple times during the visit..  Patient was alert and oriented, and appeared to have appropriate judgment.       Assessment and Plan   1. COVID-19   2. Vitamin D deficiency disease   3. Gastroesophageal reflux disease, unspecified whether esophagitis present   4. Morbid obesity ([place])   5. Benign essential hypertension      Plan: 1.  She does have multiple risk factors for progression to severe disease.  We did discuss trying Paxil a bit and she is agreeable to this.  Prescription sent today.  She was told that if symptoms worsen she is to proceed to the emergency department.  Tells me she understands. 2.  She will continue taking her vitamin D3 supplement.  She will be due to have vitamin D3 serum check at next office visit. 3.  Continue taking pantoprazole.  May consider titrating off of this in the near future and if symptoms return may consider further work-up with gastroenterology. 4.  I encouraged her to call the healthy weight and wellness clinic when she is feeling better to see if she can get an appointment scheduled.  She was encouraged to let me know if she does not hear from them so we can assist  in this. 5.  Blood pressure a bit elevated today but this may be related to her being sick so we will just monitor this closely at next office visit.   Tests ordered No orders of the defined types were placed in this encounter.     Meds ordered this encounter  Medications  . nirmatrelvir/ritonavir EUA (PAXLOVID) TABS    Sig: Patient GFR is 85.  Take nirmatrelvir (150 mg) two tablets twice daily for 5 days and ritonavir (100 mg) one tablet twice daily for 5 days.    Dispense:  30 tablet    Refill:  0    Order Specific Question:   Supervising Provider    Answer:   Doree Albee [[id number]]  Patient to follow-up in 3 months or sooner as needed.  Ailene Ards, NP

## 2021-02-08 ENCOUNTER — Telehealth: Payer: Self-pay | Admitting: Physician Assistant

## 2021-02-08 NOTE — Telephone Encounter (Signed)
Called to discuss with patient about COVID-19 symptoms and the use of one of the available treatments for those with mild to moderate Covid symptoms and at a high risk of hospitalization.  Pt appears to qualify for outpatient treatment due to co-morbid conditions and/or a member of an at-risk group in accordance with the FDA Emergency Use Authorization.    Called pt and left a VM and mychart message. Then realized PCP already Rx'd paxlovid.   Angelena Form

## 2021-02-27 ENCOUNTER — Other Ambulatory Visit (INDEPENDENT_AMBULATORY_CARE_PROVIDER_SITE_OTHER): Payer: Self-pay | Admitting: Internal Medicine

## 2021-02-27 DIAGNOSIS — I10 Essential (primary) hypertension: Secondary | ICD-10-CM

## 2021-04-05 ENCOUNTER — Telehealth (INDEPENDENT_AMBULATORY_CARE_PROVIDER_SITE_OTHER): Payer: Self-pay | Admitting: Nurse Practitioner

## 2021-04-05 NOTE — Telephone Encounter (Signed)
Please call this patient and offer her either a virtual or in person visit at the next available visit next week or the week after, the patient's mother reached out and mention the patient's been experiencing a lot of anxiety.  We need to discuss treatment options but this needs to be done in a visit format.  However as stated above can be done either virtually or in person. Thank you.

## 2021-04-11 ENCOUNTER — Encounter (INDEPENDENT_AMBULATORY_CARE_PROVIDER_SITE_OTHER): Payer: Self-pay | Admitting: Nurse Practitioner

## 2021-04-11 ENCOUNTER — Telehealth (INDEPENDENT_AMBULATORY_CARE_PROVIDER_SITE_OTHER): Payer: 59 | Admitting: Nurse Practitioner

## 2021-04-11 DIAGNOSIS — F419 Anxiety disorder, unspecified: Secondary | ICD-10-CM

## 2021-04-11 MED ORDER — HYDROXYZINE PAMOATE 25 MG PO CAPS: 25.0000 mg | ORAL_CAPSULE | Freq: Two times a day (BID) | ORAL | 2 refills | Status: DC | PRN

## 2021-04-11 MED ORDER — SERTRALINE HCL 50 MG PO TABS: 50.0000 mg | ORAL_TABLET | Freq: Every day | ORAL | 2 refills | Status: DC

## 2021-04-11 NOTE — Progress Notes (Signed)
Due to national recommendations of social distancing related to the [place] pandemic, an audio-only tele-health visit was felt to be the most appropriate encounter type for this patient today. I connected with  Janet Deleon on 04/11/21 utilizing audio-only technology and verified that I am speaking with the correct person using two identifiers. The patient was located at their home, and I was located at home during the encounter. I discussed the limitations of evaluation and management by telemedicine. The patient expressed understanding and agreed to proceed.   Subjective:  Patient ID: Janet Deleon, female    DOB: [birth year]  Age: 40 y.o. MRN: [id number]  CC:  Chief Complaint  Patient presents with   Anxiety      HPI  This patient arrives today for a virtual visit for the above.  Anxiety: She tells me she has had anxiety on and off for many years.  She tells me recently she feels that she cannot manage on her own any longer.  She denies any suicidal thoughts.  She tells me she is having a hard time sleeping she is able to fall asleep is not able to stay asleep.  She is a bit emotional as were discussing her symptoms.  She denies pregnancy and tells me her last menstrual period was last week.  She also tells me she is not trying to conceive currently.  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   Social History   Tobacco Use   Smoking status: Former   Smokeless tobacco: Never  Substance Use Topics   Alcohol use: No    Alcohol/week: 0.0 standard drinks     Current Meds  Medication Sig    hydrOXYzine (VISTARIL) 25 MG capsule Take 1 capsule (25 mg total) by mouth 2 (two) times daily as needed for anxiety.   sertraline (ZOLOFT) 50 MG tablet Take 1 tablet (50 mg total) by mouth daily.    ROS:  Review of Systems  Psychiatric/Behavioral:  Negative for suicidal ideas. The patient is nervous/anxious and has insomnia.     Objective:   Today's Vitals: LMP 04/02/2021 (Approximate)  Vitals with BMI 04/11/2021 02/07/2021 12/28/2020  Height - - '5\' 2"'$   Weight - - 298 lbs 3 oz  BMI - - [id number]  Systolic (No Data) [id number]  Diastolic (No Data) 86 81  Pulse - - 89     Physical Exam Comprehensive physical exam not completed today as office visit was conducted remotely.  Patient sounded well over the phone..  Patient was alert and oriented, and appeared to have appropriate judgment.       Assessment and Plan   1. Anxiety      Plan: 1.  Per shared decision making we will try pharmacological therapy.  I will prescribe her hydroxyzine that she can take at night as needed to help her with sleeping.  We did discuss that she could take it during the day as well but it very well make her drowsy and I recommend she try to avoid taking it during the  day or while driving because she cares for her daughter during the day.  She tells me she understands.  We will also start her on low-dose Zoloft and see if this will help with her symptoms.  We did discuss black box warning of suicidal ideation and she will follow-up with me at the end of this month to make sure she is not having any suicidal ideation since starting medication.  I also did disclose to her that unfortunately Dr. Anastasio Champion has passed away and this practice will be shutting down at the end of this month.  She was informed that she will need to find another primary care provider.  We also discussed that I will refer her to psychiatry to see if they can help with managing her anxiety moving forward.  She expresses her understanding.   Tests  ordered Orders Placed This Encounter  Procedures   Ambulatory referral to Psychiatry       Meds ordered this encounter  Medications   sertraline (ZOLOFT) 50 MG tablet    Sig: Take 1 tablet (50 mg total) by mouth daily.    Dispense:  30 tablet    Refill:  2    Order Specific Question:   Supervising Provider    Answer:   Lindell Spar [id number]   hydrOXYzine (VISTARIL) 25 MG capsule    Sig: Take 1 capsule (25 mg total) by mouth 2 (two) times daily as needed for anxiety.    Dispense:  30 capsule    Refill:  2    Order Specific Question:   Supervising Provider    Answer:   Lindell Spar [id number]     Patient to follow-up in 3 weeks or sooner as needed.  Total time spent on the telephone today was 16 minutes and 30 seconds.  Ailene Ards, NP

## 2021-04-19 ENCOUNTER — Other Ambulatory Visit (INDEPENDENT_AMBULATORY_CARE_PROVIDER_SITE_OTHER): Payer: Self-pay | Admitting: Nurse Practitioner

## 2021-04-19 ENCOUNTER — Encounter (INDEPENDENT_AMBULATORY_CARE_PROVIDER_SITE_OTHER): Payer: Self-pay | Admitting: Nurse Practitioner

## 2021-04-19 DIAGNOSIS — F419 Anxiety disorder, unspecified: Secondary | ICD-10-CM

## 2021-04-19 MED ORDER — ESCITALOPRAM OXALATE 5 MG PO TABS: ORAL_TABLET | ORAL | 1 refills | Status: DC

## 2021-05-02 ENCOUNTER — Other Ambulatory Visit (INDEPENDENT_AMBULATORY_CARE_PROVIDER_SITE_OTHER): Payer: Self-pay | Admitting: Nurse Practitioner

## 2021-05-02 DIAGNOSIS — F419 Anxiety disorder, unspecified: Secondary | ICD-10-CM

## 2021-05-02 MED ORDER — HYDROXYZINE PAMOATE 50 MG PO CAPS: 50.0000 mg | ORAL_CAPSULE | Freq: Two times a day (BID) | ORAL | 1 refills | Status: DC | PRN

## 2021-05-03 ENCOUNTER — Encounter (INDEPENDENT_AMBULATORY_CARE_PROVIDER_SITE_OTHER): Payer: Self-pay | Admitting: Nurse Practitioner

## 2021-05-03 ENCOUNTER — Ambulatory Visit (INDEPENDENT_AMBULATORY_CARE_PROVIDER_SITE_OTHER): Payer: 59 | Admitting: Nurse Practitioner

## 2021-05-03 ENCOUNTER — Other Ambulatory Visit: Payer: Self-pay

## 2021-05-03 VITALS — BP 122/86 | HR 82 | Temp 96.8°F | Ht 62.0 in | Wt 291.8 lb

## 2021-05-03 DIAGNOSIS — F419 Anxiety disorder, unspecified: Secondary | ICD-10-CM

## 2021-05-03 MED ORDER — ESCITALOPRAM OXALATE 10 MG PO TABS: 10.0000 mg | ORAL_TABLET | Freq: Every day | ORAL | 1 refills | Status: DC

## 2021-05-03 NOTE — Progress Notes (Signed)
Subjective:  Patient ID: Janet Deleon, female    DOB: 10/22/1980  Age: 40 y.o. MRN: [id number]  CC:  Chief Complaint  Patient presents with   Follow-up    New anxiety medication is working well      HPI  This patient arrives today for the above.  Today as a follow-up to discuss her anxiety.  We recently started her on Lexapro she is on 10 mg a mouth daily.  She is tolerating this well.  We also increased her hydroxyzine that she can take as needed for anxiety to 50-'100mg'$  twice a day as needed.  She tells me that she feels her mood is much improved.  She denies any suicidal ideation.  She has no other acute complaints today.  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   Social History   Tobacco Use   Smoking status: Former   Smokeless tobacco: Never  Substance Use Topics   Alcohol use: No    Alcohol/week: 0.0 standard drinks     Current Meds  Medication Sig   albuterol (VENTOLIN HFA) 108 (90 Base) MCG/ACT inhaler Inhale 2 puffs into the lungs every 6 (six) hours as needed for wheezing or shortness of breath.   Cholecalciferol (VITAMIN D3) 25 MCG (1000 UT) CAPS Take 1 capsule (1,000 Units total) by mouth daily.   clotrimazole-betamethasone (LOTRISONE) cream Apply 1 application topically 2 (two) times daily.   escitalopram (LEXAPRO) 10 MG tablet Take 1 tablet (10 mg total) by mouth daily.   fluticasone (FLONASE) 50 MCG/ACT nasal spray Place 2 sprays into both nostrils daily.   hydrOXYzine (VISTARIL) 50 MG capsule Take 1-2 capsules (50-100 mg total) by mouth 2 (two) times daily as needed  for anxiety.   ibuprofen (ADVIL) 600 MG tablet Take 1 tablet (600 mg total) by mouth every 8 (eight) hours as needed.   labetalol (NORMODYNE) 200 MG tablet Take 1 tablet (200 mg total) by mouth daily.   levocetirizine (XYZAL) 5 MG tablet Take 1 tablet (5 mg total) by mouth every evening.   pantoprazole (PROTONIX) 40 MG tablet Take 1 tablet (40 mg total) by mouth daily.   [DISCONTINUED] escitalopram (LEXAPRO) 5 MG tablet Take 1 tablet by mouth daily for 1 week, then increase to 2 tablets by mouth daily.    ROS:  See HPI   Objective:   Today's Vitals: BP 122/86   Pulse 82   Temp (!) 96.8 F (36 C) (Temporal)   Ht '5\' 2"'$  (1.575 m)   Wt 291 lb 12.8 oz (132.4 kg)   SpO2 96%   BMI 53.37 kg/m  Vitals with BMI 05/03/2021 04/11/2021 02/07/2021  Height '5\' 2"'$  - -  Weight 291 lbs 13 oz - -  BMI 0000000 - -  Systolic 123XX123 (No Data) 123XX123  Diastolic 86 (No Data) 86  Pulse 82 - -     Physical Exam Vitals reviewed.  Constitutional:      General: She is not in acute distress.    Appearance: Normal appearance.  HENT:  Head: Normocephalic and atraumatic.  Neck:     Vascular: No carotid bruit.  Cardiovascular:     Rate and Rhythm: Normal rate and regular rhythm.     Pulses: Normal pulses.     Heart sounds: Normal heart sounds.  Pulmonary:     Effort: Pulmonary effort is normal.     Breath sounds: Normal breath sounds.  Skin:    General: Skin is warm and dry.  Neurological:     General: No focal deficit present.     Mental Status: She is alert and oriented to person, place, and time.  Psychiatric:        Mood and Affect: Mood normal.        Behavior: Behavior normal.        Judgment: Judgment normal.         Assessment and Plan   1. Anxiety      Plan: 1.  She will continue on her Lexapro 10 mg by mouth daily.  She will also continue taking hydroxyzine as needed.  Normally I would encourage her to call the office any questions or concerns, however because we are closing  permanently within the next couple of days, phones are getting ready to be shut off and she was encouraged to consider calling my new office so she can continue working with me if she would like but if not she was given other offices in the area that she can call so she can establish with a new primary care provider.   Tests ordered No orders of the defined types were placed in this encounter.     Meds ordered this encounter  Medications   escitalopram (LEXAPRO) 10 MG tablet    Sig: Take 1 tablet (10 mg total) by mouth daily.    Dispense:  90 tablet    Refill:  1    Order Specific Question:   Supervising Provider    Answer:   Lindell Spar [id number]    Patient not scheduled for follow-up as this office is closing permanently as of 05/09/21 due to the passing of Dr. Anastasio Champion.  The patient was notified of this and that they will need to find a new primary care provider.  They express understanding.   Ailene Ards, NP

## 2021-05-17 ENCOUNTER — Ambulatory Visit (INDEPENDENT_AMBULATORY_CARE_PROVIDER_SITE_OTHER): Payer: 59 | Admitting: Internal Medicine

## 2021-06-07 ENCOUNTER — Ambulatory Visit: Payer: 59 | Admitting: Nurse Practitioner

## 2021-06-14 ENCOUNTER — Encounter: Payer: Self-pay | Admitting: Nurse Practitioner

## 2021-06-14 ENCOUNTER — Other Ambulatory Visit: Payer: Self-pay

## 2021-06-14 ENCOUNTER — Ambulatory Visit (INDEPENDENT_AMBULATORY_CARE_PROVIDER_SITE_OTHER): Payer: 59 | Admitting: Nurse Practitioner

## 2021-06-14 VITALS — BP 124/70 | HR 77 | Resp 18 | Ht 62.0 in | Wt 285.6 lb

## 2021-06-14 DIAGNOSIS — E785 Hyperlipidemia, unspecified: Secondary | ICD-10-CM | POA: Diagnosis not present

## 2021-06-14 DIAGNOSIS — E559 Vitamin D deficiency, unspecified: Secondary | ICD-10-CM

## 2021-06-14 DIAGNOSIS — I10 Essential (primary) hypertension: Secondary | ICD-10-CM | POA: Diagnosis not present

## 2021-06-14 DIAGNOSIS — Z131 Encounter for screening for diabetes mellitus: Secondary | ICD-10-CM

## 2021-06-14 DIAGNOSIS — N926 Irregular menstruation, unspecified: Secondary | ICD-10-CM

## 2021-06-14 LAB — CBC WITH DIFFERENTIAL/PLATELET
Basophils Absolute: 0.1 10*3/uL (ref 0.0–0.1)
Basophils Relative: 0.8 % (ref 0.0–3.0)
Eosinophils Absolute: 0.1 10*3/uL (ref 0.0–0.7)
Eosinophils Relative: 1.2 % (ref 0.0–5.0)
HCT: 35 % — ABNORMAL LOW (ref 36.0–46.0)
Hemoglobin: 10.6 g/dL — ABNORMAL LOW (ref 12.0–15.0)
Lymphocytes Relative: 19.8 % (ref 12.0–46.0)
Lymphs Abs: 2.5 10*3/uL (ref 0.7–4.0)
MCHC: 30.3 g/dL (ref 30.0–36.0)
MCV: 67.4 fl — ABNORMAL LOW (ref 78.0–100.0)
Monocytes Absolute: 0.8 10*3/uL (ref 0.1–1.0)
Monocytes Relative: 6.1 % (ref 3.0–12.0)
Neutro Abs: 9.2 10*3/uL — ABNORMAL HIGH (ref 1.4–7.7)
Neutrophils Relative %: 72.1 % (ref 43.0–77.0)
Platelets: 377 10*3/uL (ref 150.0–400.0)
RBC: 5.19 Mil/uL — ABNORMAL HIGH (ref 3.87–5.11)
RDW: 17.6 % — ABNORMAL HIGH (ref 11.5–15.5)
WBC: 12.7 10*3/uL — ABNORMAL HIGH (ref 4.0–10.5)

## 2021-06-14 LAB — COMPREHENSIVE METABOLIC PANEL
ALT: 13 U/L (ref 0–35)
AST: 16 U/L (ref 0–37)
Albumin: 4 g/dL (ref 3.5–5.2)
Alkaline Phosphatase: 77 U/L (ref 39–117)
BUN: 12 mg/dL (ref 6–23)
CO2: 29 mEq/L (ref 19–32)
Calcium: 9.4 mg/dL (ref 8.4–10.5)
Chloride: 104 mEq/L (ref 96–112)
Creatinine, Ser: 0.91 mg/dL (ref 0.40–1.20)
GFR: 77.7 mL/min (ref >60.00)
Glucose, Bld: 100 mg/dL — ABNORMAL HIGH (ref 70–99)
Potassium: 4 mEq/L (ref 3.5–5.1)
Sodium: 138 mEq/L (ref 135–145)
Total Bilirubin: 0.3 mg/dL (ref 0.2–1.2)
Total Protein: 6.9 g/dL (ref 6.0–8.3)

## 2021-06-14 LAB — TSH: TSH: 0.92 u[IU]/mL (ref 0.35–5.50)

## 2021-06-14 LAB — LIPID PANEL
Cholesterol: 210 mg/dL — ABNORMAL HIGH (ref 0–200)
HDL: 33.9 mg/dL — ABNORMAL LOW (ref >39.00)
LDL Cholesterol: 138 mg/dL — ABNORMAL HIGH (ref 0–99)
NonHDL: 175.97
Total CHOL/HDL Ratio: 6
Triglycerides: 191 mg/dL — ABNORMAL HIGH (ref 0.0–149.0)
VLDL: 38.2 mg/dL (ref 0.0–40.0)

## 2021-06-14 LAB — HEMOGLOBIN A1C: Hgb A1c MFr Bld: 5.9 % (ref 4.6–6.5)

## 2021-06-14 LAB — POCT URINE PREGNANCY: Preg Test, Ur: NEGATIVE

## 2021-06-14 LAB — VITAMIN D 25 HYDROXY (VIT D DEFICIENCY, FRACTURES): VITD: 8.09 ng/mL — ABNORMAL LOW (ref 30.00–100.00)

## 2021-06-14 NOTE — Addendum Note (Signed)
Addended by: Jacobo Forest on: 06/14/2021 02:49 PM   Modules accepted: Orders

## 2021-06-14 NOTE — Progress Notes (Signed)
Subjective:  Patient ID: Janet Deleon, female    DOB: 1980/12/02  Age: 40 y.o. MRN: [id number]  CC:  Chief Complaint  Patient presents with   Follow-up    Pt states that she does not have any concerns.       HPI  This patient arrives today for the above.  She is reestablishing care with me as she was a patient of mine at my previous office.  She has no acute complaints today.  She did mention that sometimes she will get a feeling of nausea and heat that will come over her rather rapidly and then resolve spontaneously.  Her and her husband are wondering if it could be hot flashes.  She does tell me that she thinks she has not missed a period last couple of months but her husband believes she may have missed 1.  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   Social History   Tobacco Use   Smoking status: Former   Smokeless tobacco: Never  Substance Use Topics   Alcohol use: No    Alcohol/week: 0.0 standard drinks     Current Meds  Medication Sig   albuterol (VENTOLIN HFA) 108 (90 Base) MCG/ACT inhaler Inhale 2 puffs into the lungs every 6 (six) hours as needed for wheezing or shortness of breath.   Cholecalciferol (VITAMIN D3) 25 MCG (1000 UT) CAPS Take 1 capsule (1,000 Units total) by mouth daily.   clotrimazole-betamethasone (LOTRISONE) cream Apply 1 application topically 2 (two) times daily.   escitalopram (LEXAPRO) 10 MG tablet Take 1 tablet (10 mg total) by mouth daily.   fluticasone (FLONASE) 50 MCG/ACT nasal spray Place 2 sprays into both nostrils daily.   hydrOXYzine (VISTARIL) 50  MG capsule Take 1-2 capsules (50-100 mg total) by mouth 2 (two) times daily as needed for anxiety.   ibuprofen (ADVIL) 600 MG tablet Take 1 tablet (600 mg total) by mouth every 8 (eight) hours as needed.   labetalol (NORMODYNE) 200 MG tablet Take 1 tablet (200 mg total) by mouth daily.   levocetirizine (XYZAL) 5 MG tablet Take 1 tablet (5 mg total) by mouth every evening.   pantoprazole (PROTONIX) 40 MG tablet Take 1 tablet (40 mg total) by mouth daily.    ROS:  Review of Systems  Constitutional:  Negative for fever.  Respiratory:  Negative for shortness of breath.   Cardiovascular:  Negative for chest pain.  Gastrointestinal:  Negative for abdominal pain.  Neurological:  Negative for headaches.    Objective:   Today's Vitals: BP 124/70   Pulse 77   Resp 18   Ht 5\' 2"  (1.575 m)   Wt 285 lb 9.6 oz (129.5 kg)   SpO2 95%   BMI 52.24 kg/m  Vitals with BMI 06/14/2021 05/03/2021 04/11/2021  Height 5\' 2"  5\' 2"  -  Weight 285 lbs 10 oz 291 lbs 13 oz -  BMI 12.24 82.50 -  Systolic 037 048 (No Data)  Diastolic 70 86 (No Data)  Pulse 77 82 -  Physical Exam Vitals reviewed.  Constitutional:      General: She is not in acute distress.    Appearance: Normal appearance.  HENT:     Head: Normocephalic and atraumatic.  Neck:     Vascular: No carotid bruit.  Cardiovascular:     Rate and Rhythm: Normal rate and regular rhythm.     Pulses: Normal pulses.     Heart sounds: Normal heart sounds.  Pulmonary:     Effort: Pulmonary effort is normal.     Breath sounds: Normal breath sounds.  Skin:    General: Skin is warm and dry.  Neurological:     General: No focal deficit present.     Mental Status: She is alert and oriented to person, place, and time.  Psychiatric:        Mood and Affect: Mood normal.        Behavior: Behavior normal.        Judgment: Judgment normal.         Assessment and Plan   1. Benign essential hypertension   2. Hyperlipidemia, unspecified  hyperlipidemia type   3. Morbid obesity ([place])   4. Vitamin D deficiency disease   5. Screening for diabetes mellitus   6. Missed period      Plan: 1.-6.  We will check blood work for further evaluation today.  Point-of-care pregnancy test was negative today.  Etiology of the nausea and heat unclear at this time.  May consider additional work-up if symptoms persist.  She will follow-up in 3 months for her annual physical exam, or sooner as needed.   Tests ordered Orders Placed This Encounter  Procedures   TSH   Hemoglobin A1c   Lipid panel   Comprehensive metabolic panel   CBC with Differential/Platelet   Vitamin D (25 hydroxy)   POCT urine pregnancy      No orders of the defined types were placed in this encounter.   Patient to follow-up in 3 months for annual physical exam or sooner as needed.  Ailene Ards, NP

## 2021-06-15 ENCOUNTER — Encounter: Payer: Self-pay | Admitting: Nurse Practitioner

## 2021-07-08 ENCOUNTER — Other Ambulatory Visit (INDEPENDENT_AMBULATORY_CARE_PROVIDER_SITE_OTHER): Payer: Self-pay | Admitting: Nurse Practitioner

## 2021-07-08 DIAGNOSIS — F419 Anxiety disorder, unspecified: Secondary | ICD-10-CM

## 2021-07-10 ENCOUNTER — Other Ambulatory Visit: Payer: Self-pay

## 2021-09-25 ENCOUNTER — Other Ambulatory Visit (INDEPENDENT_AMBULATORY_CARE_PROVIDER_SITE_OTHER): Payer: Self-pay | Admitting: Nurse Practitioner

## 2021-09-25 DIAGNOSIS — K219 Gastro-esophageal reflux disease without esophagitis: Secondary | ICD-10-CM

## 2021-09-27 ENCOUNTER — Ambulatory Visit (INDEPENDENT_AMBULATORY_CARE_PROVIDER_SITE_OTHER): Payer: 59

## 2021-09-27 ENCOUNTER — Encounter: Payer: Self-pay | Admitting: Nurse Practitioner

## 2021-09-27 ENCOUNTER — Other Ambulatory Visit: Payer: Self-pay

## 2021-09-27 ENCOUNTER — Ambulatory Visit (INDEPENDENT_AMBULATORY_CARE_PROVIDER_SITE_OTHER): Payer: 59 | Admitting: Nurse Practitioner

## 2021-09-27 ENCOUNTER — Other Ambulatory Visit: Payer: Self-pay | Admitting: Nurse Practitioner

## 2021-09-27 ENCOUNTER — Ambulatory Visit: Payer: 59

## 2021-09-27 VITALS — BP 122/78 | HR 79 | Temp 98.6°F | Ht 62.0 in | Wt 281.0 lb

## 2021-09-27 DIAGNOSIS — E559 Vitamin D deficiency, unspecified: Secondary | ICD-10-CM

## 2021-09-27 DIAGNOSIS — R2243 Localized swelling, mass and lump, lower limb, bilateral: Secondary | ICD-10-CM

## 2021-09-27 DIAGNOSIS — D509 Iron deficiency anemia, unspecified: Secondary | ICD-10-CM | POA: Diagnosis not present

## 2021-09-27 DIAGNOSIS — D229 Melanocytic nevi, unspecified: Secondary | ICD-10-CM | POA: Diagnosis not present

## 2021-09-27 DIAGNOSIS — D72829 Elevated white blood cell count, unspecified: Secondary | ICD-10-CM

## 2021-09-27 DIAGNOSIS — M542 Cervicalgia: Secondary | ICD-10-CM | POA: Diagnosis not present

## 2021-09-27 DIAGNOSIS — R7303 Prediabetes: Secondary | ICD-10-CM | POA: Diagnosis not present

## 2021-09-27 DIAGNOSIS — M2578 Osteophyte, vertebrae: Secondary | ICD-10-CM | POA: Diagnosis not present

## 2021-09-27 DIAGNOSIS — M47812 Spondylosis without myelopathy or radiculopathy, cervical region: Secondary | ICD-10-CM | POA: Diagnosis not present

## 2021-09-27 LAB — IRON, TOTAL/TOTAL IRON BINDING CAP
%SAT: 4 % (calc) — ABNORMAL LOW (ref 16–45)
Iron: 19 ug/dL — ABNORMAL LOW (ref 40–190)
TIBC: 444 mcg/dL (calc) (ref 250–450)

## 2021-09-27 LAB — CBC WITH DIFFERENTIAL/PLATELET
Basophils Absolute: 0.1 10*3/uL (ref 0.0–0.1)
Basophils Relative: 0.8 % (ref 0.0–3.0)
Eosinophils Absolute: 0.2 10*3/uL (ref 0.0–0.7)
Eosinophils Relative: 1.6 % (ref 0.0–5.0)
HCT: 35.8 % — ABNORMAL LOW (ref 36.0–46.0)
Hemoglobin: 10.5 g/dL — ABNORMAL LOW (ref 12.0–15.0)
Lymphocytes Relative: 23.6 % (ref 12.0–46.0)
Lymphs Abs: 3 10*3/uL (ref 0.7–4.0)
MCHC: 29.3 g/dL — ABNORMAL LOW (ref 30.0–36.0)
MCV: 66.8 fl — ABNORMAL LOW (ref 78.0–100.0)
Monocytes Absolute: 0.7 10*3/uL (ref 0.1–1.0)
Monocytes Relative: 5.2 % (ref 3.0–12.0)
Neutro Abs: 8.8 10*3/uL — ABNORMAL HIGH (ref 1.4–7.7)
Neutrophils Relative %: 68.8 % (ref 43.0–77.0)
Platelets: 410 10*3/uL — ABNORMAL HIGH (ref 150.0–400.0)
RBC: 5.36 Mil/uL — ABNORMAL HIGH (ref 3.87–5.11)
RDW: 18.8 % — ABNORMAL HIGH (ref 11.5–15.5)
WBC: 12.8 10*3/uL — ABNORMAL HIGH (ref 4.0–10.5)

## 2021-09-27 LAB — FERRITIN: Ferritin: 4.7 ng/mL — ABNORMAL LOW (ref 10.0–291.0)

## 2021-09-27 LAB — HEMOGLOBIN A1C: Hgb A1c MFr Bld: 5.9 % (ref 4.6–6.5)
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
## 2021-09-28 ENCOUNTER — Encounter: Payer: Self-pay | Admitting: Nurse Practitioner

## 2021-09-28 ENCOUNTER — Other Ambulatory Visit: Payer: Self-pay | Admitting: Nurse Practitioner

## 2021-09-28 DIAGNOSIS — I10 Essential (primary) hypertension: Secondary | ICD-10-CM

## 2021-09-28 DIAGNOSIS — F419 Anxiety disorder, unspecified: Secondary | ICD-10-CM

## 2021-09-28 MED ORDER — LABETALOL HCL 200 MG PO TABS: 200.0000 mg | ORAL_TABLET | Freq: Every day | ORAL | 1 refills | Status: DC

## 2021-09-28 MED ORDER — WEGOVY 0.25 MG/0.5ML ~~LOC~~ SOAJ: 0.2500 mg | SUBCUTANEOUS | 1 refills | Status: DC

## 2021-09-28 MED ORDER — ESCITALOPRAM OXALATE 10 MG PO TABS: 10.0000 mg | ORAL_TABLET | Freq: Every day | ORAL | 1 refills | Status: DC

## 2021-09-28 MED ORDER — HYDROXYZINE PAMOATE 50 MG PO CAPS: 50.0000 mg | ORAL_CAPSULE | Freq: Two times a day (BID) | ORAL | 1 refills | Status: DC | PRN

## 2021-09-28 NOTE — Progress Notes (Signed)
Ordering wegovy

## 2021-10-01 ENCOUNTER — Telehealth: Payer: Self-pay | Admitting: Hematology

## 2021-10-01 NOTE — Telephone Encounter (Signed)
Scheduled appt per 1/19 referral. Spoke to pt who is aware of appt date and time. Pt is aware to arrive 15 mins prior to appt time.  

## 2021-10-11 ENCOUNTER — Encounter: Payer: Self-pay | Admitting: Nurse Practitioner
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
## 2021-10-17 ENCOUNTER — Other Ambulatory Visit: Payer: Self-pay

## 2021-10-17 ENCOUNTER — Inpatient Hospital Stay: Payer: 59 | Attending: Hematology | Admitting: Hematology

## 2021-10-17 VITALS — BP 138/90 | HR 72 | Temp 98.1°F | Resp 20 | Wt 284.0 lb

## 2021-10-17 DIAGNOSIS — D500 Iron deficiency anemia secondary to blood loss (chronic): Secondary | ICD-10-CM

## 2021-10-17 DIAGNOSIS — R7303 Prediabetes: Secondary | ICD-10-CM | POA: Insufficient documentation

## 2021-10-17 DIAGNOSIS — D72829 Elevated white blood cell count, unspecified: Secondary | ICD-10-CM | POA: Insufficient documentation

## 2021-10-17 DIAGNOSIS — D75839 Thrombocytosis, unspecified: Secondary | ICD-10-CM | POA: Diagnosis not present

## 2021-10-17 DIAGNOSIS — Z87891 Personal history of nicotine dependence: Secondary | ICD-10-CM | POA: Insufficient documentation

## 2021-10-17 DIAGNOSIS — E559 Vitamin D deficiency, unspecified: Secondary | ICD-10-CM | POA: Diagnosis not present

## 2021-10-17 DIAGNOSIS — I10 Essential (primary) hypertension: Secondary | ICD-10-CM | POA: Insufficient documentation

## 2021-10-17 DIAGNOSIS — E282 Polycystic ovarian syndrome: Secondary | ICD-10-CM | POA: Insufficient documentation

## 2021-10-17 DIAGNOSIS — D509 Iron deficiency anemia, unspecified: Secondary | ICD-10-CM | POA: Insufficient documentation

## 2021-10-19 ENCOUNTER — Telehealth: Payer: Self-pay | Admitting: Hematology

## 2021-10-19 NOTE — Telephone Encounter (Signed)
Left message with follow-up appointments per 2/8 los. °

## 2021-10-23 ENCOUNTER — Other Ambulatory Visit: Payer: Self-pay | Admitting: Hematology

## 2021-10-23 ENCOUNTER — Encounter: Payer: Self-pay | Admitting: Hematology

## 2021-10-24 ENCOUNTER — Telehealth: Payer: Self-pay | Admitting: Hematology

## 2021-10-24 NOTE — Telephone Encounter (Signed)
Called patient to schedule appointment per 2/14 inbasket, patient is aware of date and time.

## 2021-10-25 ENCOUNTER — Other Ambulatory Visit: Payer: Self-pay | Admitting: Nurse Practitioner

## 2021-10-25 MED ORDER — SAXENDA 18 MG/3ML ~~LOC~~ SOPN: PEN_INJECTOR | SUBCUTANEOUS | 2 refills | Status: DC

## 2021-10-26 ENCOUNTER — Ambulatory Visit: Payer: 59

## 2021-10-26 ENCOUNTER — Inpatient Hospital Stay: Payer: 59

## 2021-10-29 ENCOUNTER — Ambulatory Visit (HOSPITAL_COMMUNITY): Payer: 59

## 2021-10-30 ENCOUNTER — Inpatient Hospital Stay: Payer: 59

## 2021-11-01 ENCOUNTER — Ambulatory Visit: Payer: 59

## 2021-11-02 ENCOUNTER — Telehealth: Payer: Self-pay | Admitting: Nurse Practitioner

## 2021-11-02 NOTE — Telephone Encounter (Signed)
PA started for [facility]  Key: [id number]

## 2021-11-03 ENCOUNTER — Inpatient Hospital Stay: Payer: 59

## 2021-11-03 ENCOUNTER — Other Ambulatory Visit: Payer: Self-pay

## 2021-11-03 VITALS — BP 161/89 | HR 71 | Temp 97.8°F | Resp 16

## 2021-11-03 DIAGNOSIS — E559 Vitamin D deficiency, unspecified: Secondary | ICD-10-CM | POA: Diagnosis not present

## 2021-11-03 DIAGNOSIS — E282 Polycystic ovarian syndrome: Secondary | ICD-10-CM | POA: Diagnosis not present

## 2021-11-03 DIAGNOSIS — D509 Iron deficiency anemia, unspecified: Secondary | ICD-10-CM | POA: Diagnosis not present

## 2021-11-03 DIAGNOSIS — D72829 Elevated white blood cell count, unspecified: Secondary | ICD-10-CM | POA: Diagnosis not present

## 2021-11-03 DIAGNOSIS — Z87891 Personal history of nicotine dependence: Secondary | ICD-10-CM | POA: Diagnosis not present

## 2021-11-03 DIAGNOSIS — I10 Essential (primary) hypertension: Secondary | ICD-10-CM | POA: Diagnosis not present

## 2021-11-03 DIAGNOSIS — D500 Iron deficiency anemia secondary to blood loss (chronic): Secondary | ICD-10-CM

## 2021-11-03 DIAGNOSIS — R7303 Prediabetes: Secondary | ICD-10-CM | POA: Diagnosis not present

## 2021-11-03 MED ORDER — LORATADINE 10 MG PO TABS
10.0000 mg | ORAL_TABLET | Freq: Once | ORAL | Status: AC
  Administered 2021-11-03: 10 mg via ORAL

## 2021-11-03 MED ORDER — ACETAMINOPHEN 325 MG PO TABS
650.0000 mg | ORAL_TABLET | Freq: Once | ORAL | Status: AC
  Administered 2021-11-03: 650 mg via ORAL

## 2021-11-03 MED ORDER — SODIUM CHLORIDE 0.9 % IV SOLN: Freq: Once | INTRAVENOUS | Status: AC

## 2021-11-03 MED ORDER — SODIUM CHLORIDE 0.9 % IV SOLN
300.0000 mg | Freq: Once | INTRAVENOUS | Status: AC
  Administered 2021-11-03: 300 mg via INTRAVENOUS
  Filled 2021-11-03: qty 300

## 2021-11-03 NOTE — Patient Instructions (Signed)

## 2021-11-04 NOTE — Progress Notes (Signed)
Marland Kitchen   HEMATOLOGY/ONCOLOGY CONSULTATION NOTE  Date of Service: 11/04/2021  Patient Care Team: Ailene Ards, NP as PCP - General (Nurse Practitioner)  CHIEF COMPLAINTS/PURPOSE OF CONSULTATION:  Leukocytosis  HISTORY OF PRESENTING ILLNESS:   Janet Deleon is a wonderful 41 y.o. female who has been referred to [place] by Dr .Pearline Cables, Rande Brunt, NP  for evaluation and management of leukocytosis.  Patient has a history of hypertension, PCOS, prediabetes, morbid obesity, vitamin D deficiency who was referred by her primary care provider to [place] for further evaluation of chronic leukocytosis.  Patient last had labs on 09/27/2021 which showed WBC count of 12.8k with ANC of 8.8k no increase in immature granulocytes hemoglobin of 10.5 with an MCV of 66.8 and a platelet count of 410k.  Review of labs show her WBC count has been persistently elevated since at least April 2018 and have fluctuated between 11.4k-14.3k and have primarily been due to neutrophilia.  She has had previous history of iron deficiency anemia but this seems to have gotten worse in the last year or so with development of anemia and increasing microcytosis. Her recent iron labs showed ferritin of 4.7 with iron saturation of 4%.  She has also had some mild thrombocytosis currently up to 410k likely in the context of iron deficiency causing reactive change.  Patient is currently following with her primary care physician to work on weight loss.  She notes no other acute new focal symptoms. No new lumps or bumps. No unexplained fevers chills night sweats. No new skin rashes. No recent focal signs or symptoms of infection. No recent use of steroids. She does note significant fatigue and some ice cravings likely from her iron deficiency.  MEDICAL HISTORY:  Past Medical History:  Diagnosis Date   Anemia    Hypertension    PCOS (polycystic ovarian syndrome)   Morbid obesity .Body mass index is 51.94 kg/m. Prediabetes Vitamin D  deficiency History of iron deficiency due to heavy menstrual period's.   SURGICAL HISTORY: Past Surgical History:  Procedure Laterality Date   CERVICAL CERCLAGE N/A 03/10/2017   Procedure: Emergency CERVICAL CERCLAGE, Excision Vaginal Septum;  Surgeon: Azucena Fallen, MD;  Location: [facility];  Service: Gynecology;  Laterality: N/A;  EDD: 07/17/17 Allergy: Sulfa, Demerol   lt eye surgery     OVARIAN CYST REMOVAL     1996   TONSILLECTOMY      SOCIAL HISTORY: Social History   Socioeconomic History   Marital status: Married    Spouse name: Not on file   Number of children: Not on file   Years of education: Not on file   Highest education level: Not on file  Occupational History   Not on file  Tobacco Use   Smoking status: Former   Smokeless tobacco: Never  Vaping Use   Vaping Use: Never used  Substance and Sexual Activity   Alcohol use: No    Alcohol/week: 0.0 standard drinks   Drug use: No   Sexual activity: Yes  Other Topics Concern   Not on file  Social History Narrative   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   Social Determinants of Health   Financial Resource Strain: Not on file  Food Insecurity: Not on file  Transportation Needs: Not on file  Physical Activity: Not on file  Stress: Not on file  Social Connections: Not on file  Intimate Partner Violence: Not on file    FAMILY HISTORY: Family History  Problem Relation Age  of Onset   Hypertension Father    Heart attack Father    Diabetes Father    Liver cancer Paternal Grandmother    Lung cancer Paternal Grandmother     ALLERGIES:  is allergic to demerol [meperidine] and sulfa antibiotics.  MEDICATIONS:  Current Outpatient Medications  Medication Sig Dispense Refill   Cholecalciferol (VITAMIN D3) 25 MCG (1000 UT) CAPS Take 1 capsule (1,000 Units total) by mouth daily. 90 capsule 3   escitalopram (LEXAPRO) 10 MG tablet Take 1 tablet (10 mg total) by mouth daily.  90 tablet 1   fluticasone (FLONASE) 50 MCG/ACT nasal spray Place 2 sprays into both nostrils daily. 16 g 6   hydrOXYzine (VISTARIL) 50 MG capsule Take 1-2 capsules (50-100 mg total) by mouth 2 (two) times daily as needed. 180 capsule 1   ibuprofen (ADVIL) 600 MG tablet Take 1 tablet (600 mg total) by mouth every 8 (eight) hours as needed. 30 tablet 0   labetalol (NORMODYNE) 200 MG tablet Take 1 tablet (200 mg total) by mouth daily. 90 tablet 1   pantoprazole (PROTONIX) 40 MG tablet Take 1 tablet (40 mg total) by mouth daily. 90 tablet 1   Liraglutide -Weight Management (SAXENDA) 18 MG/3ML SOPN Inject 0.6mg  into the subcutaneous tissue daily for 1 week. Then increase dose to 1.2mg  into the subcutaneous tissue daily for 1 week. Then increase dose to 1.8mg  into the subcutaneous tissue daily. 9 mL 2   No current facility-administered medications for this visit.    REVIEW OF SYSTEMS:    10 Point review of Systems was done is negative except as noted above.  PHYSICAL EXAMINATION: ECOG PERFORMANCE STATUS: 1 - Symptomatic but completely ambulatory  . Vitals:   10/17/21 1254  BP: 138/90  Pulse: 72  Resp: 20  Temp: 98.1 F (36.7 C)  SpO2: 100%   Filed Weights   10/17/21 1254  Weight: 284 lb (128.8 kg)   .Body mass index is 51.94 kg/m.  GENERAL:alert, in no acute distress and comfortable SKIN: no acute rashes, no significant lesions EYES: conjunctiva are pink and non-injected, sclera anicteric OROPHARYNX: MMM, no exudates, no oropharyngeal erythema or ulceration NECK: supple, no JVD LYMPH:  no palpable lymphadenopathy in the cervical, axillary or inguinal regions LUNGS: clear to auscultation b/l with normal respiratory effort HEART: regular rate & rhythm ABDOMEN:  normoactive bowel sounds , non tender, not distended.  No palpable hepatosplenomegaly Extremity: no pedal edema PSYCH: alert & oriented x 3 with fluent speech NEURO: no focal motor/sensory deficits  LABORATORY DATA:  I  have reviewed the data as listed  . CBC Latest Ref Rng & Units 09/27/2021 06/14/2021 04/20/2020  WBC 4.0 - 10.5 K/uL 12.8(H) 12.7(H) 11.9(H)  Hemoglobin 12.0 - 15.0 g/dL 10.5 Repeated and verified X2.(L) 10.6(L) 10.4(L)  Hematocrit 36.0 - 46.0 % 35.8(L) 35.0(L) 34.9(L)  Platelets 150.0 - 400.0 K/uL 410.0(H) 377.0 463(H)   .CBC    Component Value Date/Time   WBC 12.8 (H) 09/27/2021 1451   RBC 5.36 (H) 09/27/2021 1451   HGB 10.5 Repeated and verified X2. (L) 09/27/2021 1451   HCT 35.8 (L) 09/27/2021 1451   PLT 410.0 (H) 09/27/2021 1451   MCV 66.8 (L) 09/27/2021 1451   MCH 21.1 (L) 04/20/2020 1337   MCHC 29.3 (L) 09/27/2021 1451   RDW 18.8 (H) 09/27/2021 1451   LYMPHSABS 3.0 09/27/2021 1451   MONOABS 0.7 09/27/2021 1451   EOSABS 0.2 09/27/2021 1451   BASOSABS 0.1 09/27/2021 1451    . CMP Latest  Ref Rng & Units 06/14/2021 04/20/2020 10/18/2019  Glucose 70 - 99 mg/dL 100(H) 99 93  BUN 6 - 23 mg/dL 12 12 11   Creatinine 0.40 - 1.20 mg/dL 0.91 0.85 0.87  Sodium 135 - 145 mEq/L 138 138 138  Potassium 3.5 - 5.1 mEq/L 4.0 4.2 4.3  Chloride 96 - 112 mEq/L 104 104 105  CO2 19 - 32 mEq/L 29 23 24   Calcium 8.4 - 10.5 mg/dL 9.4 9.4 9.3  Total Protein 6.0 - 8.3 g/dL 6.9 6.9 6.6  Total Bilirubin 0.2 - 1.2 mg/dL 0.3 0.4 0.4  Alkaline Phos 39 - 117 U/L 77 - -  AST 0 - 37 U/L 16 13 12   ALT 0 - 35 U/L 13 23 13    . Lab Results  Component Value Date   IRON 19 (L) 09/27/2021   TIBC 444 09/27/2021   IRONPCTSAT 4 (L) 09/27/2021   (Iron and TIBC)  Lab Results  Component Value Date   FERRITIN 4.7 (L) 09/27/2021     RADIOGRAPHIC STUDIES: I have personally reviewed the radiological images as listed and agreed with the findings in the report. No results found.  ASSESSMENT & PLAN:   41 year old female with history of  1) Chronic leukocytosis/neutrophilia This is likely reactive and probably secondary to her PCOS which is considered a chronic inflammatory state and also could potentially be  due to her morbid obesity .Body mass index is 51.94 kg/m. PLan -I discussed potential etiologies of her nonprogressive leukocytosis/neutrophilia. -This is unlikely to be of clonal bone marrow disorder. -We will get a BCR-ABL FISH panel on follow-up labs to ensure this. -She is already working with her primary care physician on management of her prediabetes and to work on weight loss.  2) Severe iron deficiency with microcytic anemia This is likely due to her menorrhagia. Patient is fairly symptomatic with significant fatigue and it is significantly affecting her quality of life. . Lab Results  Component Value Date   IRON 19 (L) 09/27/2021   TIBC 444 09/27/2021   IRONPCTSAT 4 (L) 09/27/2021   (Iron and TIBC)  Lab Results  Component Value Date   FERRITIN 4.7 (L) 09/27/2021    PLan -Discussed pros and cons of IV iron replacement and patient is agreeable to this. -Proceed with IV Injectafer weekly x2 doses and recheck labs in about 10 weeks after iron.  #3 thrombocytosis-likely reactive due to iron deficiency. -We will recheck after iron replacement.  Orders Placed This Encounter  Procedures   CBC with Differential/Platelet    Standing Status:   Future    Standing Expiration Date:   10/17/2022   CMP ([place] only)    Standing Status:   Future    Standing Expiration Date:   10/17/2022   Ferritin    Standing Status:   Future    Standing Expiration Date:   10/17/2022   Iron and TIBC    Standing Status:   Future    Standing Expiration Date:   10/17/2022   Vitamin B12    Standing Status:   Future    Standing Expiration Date:   10/17/2022   BCR ABL1 FISH ([facility])    Standing Status:   Future    Standing Expiration Date:   10/17/2022   Sedimentation rate    Standing Status:   Future    Standing Expiration Date:   10/17/2022    Follow-up IV Injectafer weekly x 2 doses Labs in 10 weeks Phone visit with Dr Irene Limbo in 12 weeks  All of the patients questions were answered with  apparent satisfaction. The patient knows to call the clinic with any problems, questions or concerns.  I spent 35 minutes counseling the patient face to face. The total time spent in the appointment was 50 minutes and more than 50% was on counseling and direct patient cares.    Sullivan Lone MD [place] AAHIVMS [facility] Hematology/Oncology Physician [facility]

## 2021-11-05 NOTE — Telephone Encounter (Signed)
PA has been denied, appeal started.

## 2021-11-09 ENCOUNTER — Ambulatory Visit: Payer: 59

## 2021-11-10 ENCOUNTER — Inpatient Hospital Stay: Payer: 59 | Attending: Hematology

## 2021-11-10 ENCOUNTER — Other Ambulatory Visit: Payer: Self-pay

## 2021-11-10 VITALS — BP 146/90 | HR 73 | Temp 97.6°F | Resp 20 | Ht 62.0 in

## 2021-11-10 DIAGNOSIS — Z79899 Other long term (current) drug therapy: Secondary | ICD-10-CM | POA: Insufficient documentation

## 2021-11-10 DIAGNOSIS — D72829 Elevated white blood cell count, unspecified: Secondary | ICD-10-CM | POA: Diagnosis not present

## 2021-11-10 DIAGNOSIS — D500 Iron deficiency anemia secondary to blood loss (chronic): Secondary | ICD-10-CM

## 2021-11-10 DIAGNOSIS — D509 Iron deficiency anemia, unspecified: Secondary | ICD-10-CM | POA: Insufficient documentation

## 2021-11-10 MED ORDER — SODIUM CHLORIDE 0.9 % IV SOLN: Freq: Once | INTRAVENOUS | Status: AC

## 2021-11-10 MED ORDER — ACETAMINOPHEN 325 MG PO TABS
650.0000 mg | ORAL_TABLET | Freq: Once | ORAL | Status: AC
  Administered 2021-11-10: 650 mg via ORAL
  Filled 2021-11-10: qty 2

## 2021-11-10 MED ORDER — LORATADINE 10 MG PO TABS
10.0000 mg | ORAL_TABLET | Freq: Once | ORAL | Status: AC
  Administered 2021-11-10: 10 mg via ORAL
  Filled 2021-11-10: qty 1

## 2021-11-10 MED ORDER — SODIUM CHLORIDE 0.9 % IV SOLN
300.0000 mg | Freq: Once | INTRAVENOUS | Status: AC
  Administered 2021-11-10: 300 mg via INTRAVENOUS
  Filled 2021-11-10: qty 15
  Filled 2021-11-10: qty 300

## 2021-11-10 NOTE — Patient Instructions (Signed)

## 2021-12-07 MED FILL — Iron Sucrose Inj 20 MG/ML (Fe Equiv): INTRAVENOUS | Qty: 15 | Status: AC

## 2021-12-08 ENCOUNTER — Inpatient Hospital Stay: Payer: 59 | Attending: Hematology

## 2021-12-26 ENCOUNTER — Other Ambulatory Visit: Payer: Self-pay

## 2021-12-26 DIAGNOSIS — D500 Iron deficiency anemia secondary to blood loss (chronic): Secondary | ICD-10-CM

## 2021-12-27 ENCOUNTER — Ambulatory Visit: Payer: 59 | Admitting: Nurse Practitioner

## 2021-12-27 ENCOUNTER — Telehealth: Payer: Self-pay | Admitting: Hematology

## 2021-12-27 NOTE — Telephone Encounter (Signed)
Per provider reschedule called and spoke to pt husband about phone visit reschedule.  Pt husband confirmed appointment  ?

## 2021-12-28 ENCOUNTER — Encounter: Payer: Self-pay | Admitting: Nurse Practitioner

## 2021-12-28 ENCOUNTER — Inpatient Hospital Stay: Payer: 59

## 2021-12-28 ENCOUNTER — Telehealth (INDEPENDENT_AMBULATORY_CARE_PROVIDER_SITE_OTHER): Payer: 59 | Admitting: Nurse Practitioner

## 2021-12-28 DIAGNOSIS — J40 Bronchitis, not specified as acute or chronic: Secondary | ICD-10-CM | POA: Insufficient documentation

## 2021-12-28 MED ORDER — BENZONATATE 100 MG PO CAPS: 100.0000 mg | ORAL_CAPSULE | Freq: Two times a day (BID) | ORAL | 0 refills | Status: DC | PRN

## 2021-12-28 MED ORDER — DOXYCYCLINE HYCLATE 100 MG PO TABS: 100.0000 mg | ORAL_TABLET | Freq: Two times a day (BID) | ORAL | 0 refills | Status: DC

## 2021-12-28 NOTE — Assessment & Plan Note (Signed)
Etiology unclear, however most likely viral in origin.  With that said symptoms have been persistent for over 2 weeks we will trial antibiotic.  Considered amoxicillin, but patient replies she may have nausea with this so per shared decision making we will trial doxycycline 100 mg by mouth twice a day for 7 days.  If symptoms persist or worsen despite taking doxycycline she was notified to call the office.  She reports her understanding.  Also prescribed Tessalon Perles that she can take as needed for cough suppression.  She denies any nasal congestion so will not refill Flonase nasal spray. ?

## 2021-12-28 NOTE — Progress Notes (Signed)
? ?An audio-only tele-health visit was completed today for this patient. I connected with  Janet Deleon on 12/28/21 utilizing audio-only technology and verified that I am speaking with the correct person using two identifiers. The patient was located at their home, and I was located at the office of [place] at [facility] during the encounter. I discussed the limitations of evaluation and management by telemedicine. The patient expressed understanding and agreed to proceed.  ? ? ? ?Subjective:  ?Patient ID: Janet Deleon, female    DOB: 03-Dec-1980  Age: 41 y.o. MRN: [id number] ? ?CC:  ?Chief Complaint  ?Patient presents with  ? Cough  ?  ? ? ?HPI  ?This patient arrives today for the above. ? ?Symptoms started 2 weeks ago.  She is experiencing cough with coughing fits.  She will feel short of breath during her coughing fits but otherwise is not experiencing shortness of breath.  She denies wheezing or fever.  She has taken over-the-counter cold and congestion medication as well as Mucinex with little to no improvement in her symptoms. ? ?Past Medical History:  ?Diagnosis Date  ? Anemia   ? Hypertension   ? PCOS (polycystic ovarian syndrome)   ? ? ? ? ?Family History  ?Problem Relation Age of Onset  ? Hypertension Father   ? Heart attack Father   ? Diabetes Father   ? Liver cancer Paternal Grandmother   ? Lung cancer Paternal Grandmother   ? ? ?Social History  ? ?Social History Narrative  ? 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  ? ?Social History  ? ?Tobacco Use  ? Smoking status: Former  ? Smokeless tobacco: Never  ?Substance Use Topics  ? Alcohol use: No  ?  Alcohol/week: 0.0 standard drinks  ? ? ? ?Current Meds  ?Medication Sig  ? benzonatate (TESSALON) 100 MG capsule Take 1 capsule (100 mg total) by mouth 2 (two) times daily as needed for cough.  ? doxycycline (VIBRA-TABS) 100 MG tablet Take 1 tablet (100 mg total) by mouth 2 (two) times daily.  ? ? ?ROS:   ?Review of Systems  ?Constitutional:  Negative for chills and fever.  ?HENT:  Negative for congestion.   ?Respiratory:  Positive for cough and shortness of breath (with coughing fits). Negative for wheezing.   ?Cardiovascular:  Negative for chest pain.  ? ? ?Objective:  ? ?Today's Vitals: There were no vitals taken for this visit. ? ?  11/10/2021  ? 12:11 PM 11/10/2021  ?  9:59 AM 11/03/2021  ?  1:26 PM  ?Vitals with BMI  ?Height  '5\' 2"'$    ?Systolic 122 482 500  ?Diastolic 90 76 89  ?Pulse 73 83 71  ?  ? ?Physical Exam ?Comprehensive physical exam not completed today as office visit was conducted remotely.  Patient sounded well over the phone, she did cough a few times during the visit. .  Patient was alert and oriented, and appeared to have appropriate judgment. ? ? ? ? ? ? ?Assessment and Plan  ? ?1. Bronchitis   ? ? ? ?Plan: ?See plan via problem list below. ? ? ?Tests ordered ?No orders of the defined types were placed in this encounter. ? ? ? ? ?Meds ordered this encounter  ?Medications  ? doxycycline (VIBRA-TABS) 100 MG tablet  ?  Sig: Take 1 tablet (100 mg total) by mouth 2 (two) times daily.  ?  Dispense:  14 tablet  ?  Refill:  0  ?  Order Specific Question:   Supervising Provider  ?  Answer:   Binnie Rail [[id number]]  ? benzonatate (TESSALON) 100 MG capsule  ?  Sig: Take 1 capsule (100 mg total) by mouth 2 (two) times daily as needed for cough.  ?  Dispense:  20 capsule  ?  Refill:  0  ?  Order Specific Question:   Supervising Provider  ?  Answer:   Binnie Rail [[id number]]  ? ? ?Patient to follow-up in as scheduled, or sooner as needed.  Total time spent on the telephone today was 7 minutes and 25 seconds. ? ?Ailene Ards, NP ? ?

## 2022-01-11 ENCOUNTER — Inpatient Hospital Stay: Payer: 59 | Admitting: Hematology

## 2022-01-14 ENCOUNTER — Telehealth: Payer: Self-pay | Admitting: Hematology

## 2022-01-14 NOTE — Telephone Encounter (Signed)
.  Called patient to schedule appointment per 5/5 inbasket, patient is aware of date and time.   ?

## 2022-01-23 ENCOUNTER — Encounter: Payer: Self-pay | Admitting: Nurse Practitioner

## 2022-01-24 ENCOUNTER — Ambulatory Visit: Payer: 59 | Admitting: Nurse Practitioner

## 2022-02-01 ENCOUNTER — Inpatient Hospital Stay: Payer: 59 | Attending: Hematology

## 2022-02-08 ENCOUNTER — Ambulatory Visit (INDEPENDENT_AMBULATORY_CARE_PROVIDER_SITE_OTHER): Payer: 59 | Admitting: Nurse Practitioner

## 2022-02-08 ENCOUNTER — Telehealth: Payer: Self-pay | Admitting: *Deleted

## 2022-02-08 ENCOUNTER — Inpatient Hospital Stay: Payer: 59 | Attending: Hematology | Admitting: Hematology

## 2022-02-08 VITALS — BP 128/82 | HR 70 | Temp 98.3°F | Ht 62.0 in | Wt 296.0 lb

## 2022-02-08 DIAGNOSIS — E559 Vitamin D deficiency, unspecified: Secondary | ICD-10-CM

## 2022-02-08 DIAGNOSIS — F419 Anxiety disorder, unspecified: Secondary | ICD-10-CM | POA: Diagnosis not present

## 2022-02-08 DIAGNOSIS — F32A Depression, unspecified: Secondary | ICD-10-CM | POA: Insufficient documentation

## 2022-02-08 DIAGNOSIS — R234 Changes in skin texture: Secondary | ICD-10-CM | POA: Diagnosis not present

## 2022-02-08 DIAGNOSIS — R519 Headache, unspecified: Secondary | ICD-10-CM | POA: Diagnosis not present

## 2022-02-08 DIAGNOSIS — R69 Illness, unspecified: Secondary | ICD-10-CM | POA: Diagnosis not present

## 2022-02-08 DIAGNOSIS — D509 Iron deficiency anemia, unspecified: Secondary | ICD-10-CM

## 2022-02-08 DIAGNOSIS — F331 Major depressive disorder, recurrent, moderate: Secondary | ICD-10-CM | POA: Insufficient documentation

## 2022-02-08 DIAGNOSIS — I10 Essential (primary) hypertension: Secondary | ICD-10-CM | POA: Diagnosis not present

## 2022-02-08 DIAGNOSIS — Z0001 Encounter for general adult medical examination with abnormal findings: Secondary | ICD-10-CM | POA: Insufficient documentation

## 2022-02-08 LAB — LIPID PANEL
Cholesterol: 212 mg/dL — ABNORMAL HIGH (ref 0–200)
HDL: 35.6 mg/dL — ABNORMAL LOW (ref >39.00)
LDL Cholesterol: 146 mg/dL — ABNORMAL HIGH (ref 0–99)
NonHDL: 176.58
Total CHOL/HDL Ratio: 6
Triglycerides: 151 mg/dL — ABNORMAL HIGH (ref 0.0–149.0)
VLDL: 30.2 mg/dL (ref 0.0–40.0)

## 2022-02-08 LAB — COMPREHENSIVE METABOLIC PANEL
ALT: 17 U/L (ref 0–35)
AST: 18 U/L (ref 0–37)
Albumin: 4.1 g/dL (ref 3.5–5.2)
Alkaline Phosphatase: 78 U/L (ref 39–117)
BUN: 10 mg/dL (ref 6–23)
CO2: 26 mEq/L (ref 19–32)
Calcium: 9.5 mg/dL (ref 8.4–10.5)
Chloride: 105 mEq/L (ref 96–112)
Creatinine, Ser: 0.94 mg/dL (ref 0.40–1.20)
GFR: 74.39 mL/min (ref >60.00)
Glucose, Bld: 94 mg/dL (ref 70–99)
Potassium: 3.9 mEq/L (ref 3.5–5.1)
Sodium: 138 mEq/L (ref 135–145)
Total Bilirubin: 0.4 mg/dL (ref 0.2–1.2)
Total Protein: 7.2 g/dL (ref 6.0–8.3)

## 2022-02-08 LAB — VITAMIN D 25 HYDROXY (VIT D DEFICIENCY, FRACTURES): VITD: <7 ng/mL — ABNORMAL LOW (ref 30.00–100.00)

## 2022-02-08 LAB — CBC
HCT: 40.6 % (ref 36.0–46.0)
Hemoglobin: 13.2 g/dL (ref 12.0–15.0)
MCHC: 32.5 g/dL (ref 30.0–36.0)
MCV: 79 fl (ref 78.0–100.0)
Platelets: 305 10*3/uL (ref 150.0–400.0)
RBC: 5.14 Mil/uL — ABNORMAL HIGH (ref 3.87–5.11)
RDW: 18.2 % — ABNORMAL HIGH (ref 11.5–15.5)
WBC: 10.9 10*3/uL — ABNORMAL HIGH (ref 4.0–10.5)

## 2022-02-08 LAB — IBC + FERRITIN
Ferritin: 10.7 ng/mL (ref 10.0–291.0)
Iron: 50 ug/dL (ref 42–145)
Saturation Ratios: 12 % — ABNORMAL LOW (ref 20.0–50.0)
TIBC: 417.2 ug/dL (ref 250.0–450.0)
Transferrin: 298 mg/dL (ref 212.0–360.0)

## 2022-02-08 LAB — IRON: Iron: 50 ug/dL (ref 42–145)

## 2022-02-08 LAB — HEMOGLOBIN A1C: Hgb A1c MFr Bld: 5.8 % (ref 4.6–6.5)

## 2022-02-08 MED ORDER — BUPROPION HCL ER (XL) 150 MG PO TB24: 150.0000 mg | ORAL_TABLET | Freq: Every day | ORAL | 1 refills | Status: DC
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
## 2022-02-08 NOTE — Assessment & Plan Note (Signed)
We will order serum vitamin D level for further evaluation, further recommendations may be made based upon the results.

## 2022-02-08 NOTE — Assessment & Plan Note (Signed)
Bilateral skin changes noted on patient's breast exam today.  No masses noted.  Will order bilateral diagnostic mammogram with ultrasound for further evaluation.

## 2022-02-08 NOTE — Assessment & Plan Note (Signed)
Blood work ordered today for further evaluation, patient encouraged to focus on healthy lifestyle.

## 2022-02-08 NOTE — Assessment & Plan Note (Signed)
Chronic, not controlled.  We will slowly titrate off of her Lexapro and transition to Wellbutrin.  Patient will follow-up in 4 to 6 weeks for close monitoring of mood.  Referral to counseling also made today.

## 2022-02-08 NOTE — Assessment & Plan Note (Signed)
Complete physical exam completed today.  Patient encouraged to follow-up for repeat annual exam next year.  Also encouraged to focus on healthy lifestyle.

## 2022-02-08 NOTE — Assessment & Plan Note (Signed)
Chronic, stable.  Patient will continue on her labetalol as currently prescribed.

## 2022-02-08 NOTE — Patient Instructions (Signed)
Take 1/2 tablet of lexapro once a day for 1 week then stop.

## 2022-02-08 NOTE — Telephone Encounter (Addendum)
Contacted patient at Dr. Grier Mitts request to r/s today's phone appt for next week once lab results are available. Patient had labs done this AM at PCP this AM instead of [facility] last week. Results are not yet available from this am lab.  Contacted patient x 2 and LVM each time with this information. Advised patient that Eunice scheduler will contact her to schedule telephone appt to review lab results.   Schedule message sent

## 2022-02-08 NOTE — Assessment & Plan Note (Signed)
Headache has persisted for approximately 6 months.  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  Further recommendations may be made based upon these results.

## 2022-02-08 NOTE — Assessment & Plan Note (Signed)
We will check CBC as well as iron levels.  Patient reports intolerance to p.o. iron in the past due to GI upset.  Patient encouraged to consider following up with hematology as she has not followed up as recommended.  Further recommendations may be made based on lab results.

## 2022-02-08 NOTE — Assessment & Plan Note (Signed)
Chronic, not controlled.  Will transition off Lexapro and start on Wellbutrin to see if this improves mood.  Patient will follow-up in 4 to 6 weeks for close monitoring.

## 2022-02-08 NOTE — Telephone Encounter (Signed)
Opened in error

## 2022-02-14 ENCOUNTER — Encounter: Payer: Self-pay | Admitting: Nurse Practitioner

## 2022-02-15 ENCOUNTER — Other Ambulatory Visit: Payer: Self-pay | Admitting: Nurse Practitioner

## 2022-02-15 DIAGNOSIS — F419 Anxiety disorder, unspecified: Secondary | ICD-10-CM

## 2022-02-18 ENCOUNTER — Inpatient Hospital Stay: Payer: 59 | Admitting: Hematology

## 2022-02-18 ENCOUNTER — Encounter: Payer: Self-pay | Admitting: Nurse Practitioner

## 2022-02-18 NOTE — Progress Notes (Incomplete)
[facility]   HEMATOLOGY/ONCOLOGY PHONE VISIT NOTE  Date of Service: 02/18/2022  Patient Care Team: Ailene Ards, NP as PCP - General (Nurse Practitioner)  CHIEF COMPLAINTS/PURPOSE OF CONSULTATION:  Continued evaluation and management of Leukocytosis.  HISTORY OF PRESENTING ILLNESS:  Please see previous note for details on initial presentation  INTERVAL HISTORY: I connected with Tanda Rockers on 02/18/2022 at 11:45 AM by telephone visit and verified that I am speaking with the correct person using two identifiers.   I discussed the limitations, risks, security and privacy concerns of performing an evaluation and management service by telemedicine and the availability of in-person appointments. I also discussed with the patient that there may be a patient responsible charge related to this service. The patient expressed understanding and agreed to proceed.   Other persons participating in the visit and their role in the encounter: None  Patient's location: Home Provider's location: [place]  Janet Deleon is a 41 y.o. female who is here for continued evaluation and management of leukocytosis. She reports She is doing well with no new symptoms or concerns.  ***  No fever, chills, night sweats. No new lumps, bumps, or lesions/rashes. No other new or acute focal symptoms.  Labs done 02/08/2022 were reviewed in detail. CBC improving counts. WBC at 10.9k. Hgb at 13.2, and platelets at 305k. CMP unremarkable. Iron sat ratio low at 12%. Ferritin at 10.7. Iron WNL. Hgb A1c at 5.8%. Vitamin D less than 7. Lipid panel shows LDL cholesterol elevated at 146. All other counts stable.  MEDICAL HISTORY:  Past Medical History:  Diagnosis Date   Anemia    Hypertension    PCOS (polycystic ovarian syndrome)   Morbid obesity .There is no height or weight on file to calculate BMI. Prediabetes Vitamin D deficiency History of iron deficiency due to heavy menstrual period's.   SURGICAL  HISTORY: Past Surgical History:  Procedure Laterality Date   CERVICAL CERCLAGE N/A 03/10/2017   Procedure: Emergency CERVICAL CERCLAGE, Excision Vaginal Septum;  Surgeon: Azucena Fallen, MD;  Location: [facility];  Service: Gynecology;  Laterality: N/A;  EDD: 07/17/17 Allergy: Sulfa, Demerol   lt eye surgery     OVARIAN CYST REMOVAL     1996   TONSILLECTOMY      SOCIAL HISTORY: Social History   Socioeconomic History   Marital status: Married    Spouse name: Not on file   Number of children: Not on file   Years of education: Not on file   Highest education level: Not on file  Occupational History   Not on file  Tobacco Use   Smoking status: Former   Smokeless tobacco: Never  Vaping Use   Vaping Use: Never used  Substance and Sexual Activity   Alcohol use: No    Alcohol/week: 0.0 standard drinks of alcohol   Drug use: No   Sexual activity: Yes  Other Topics Concern   Not on file  Social History Narrative   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   Social Determinants of Health   Financial Resource Strain: Not on file  Food Insecurity: Not on file  Transportation Needs: Not on file  Physical Activity: Not on file  Stress: Not on file  Social Connections: Not on file  Intimate Partner Violence: Not on file    FAMILY HISTORY: Family History  Problem Relation Age of Onset   Hypertension Father    Heart attack Father    Diabetes Father  Liver cancer Paternal Grandmother    Lung cancer Paternal Grandmother     ALLERGIES:  is allergic to demerol [meperidine] and sulfa antibiotics.  MEDICATIONS:  Current Outpatient Medications  Medication Sig Dispense Refill   buPROPion (WELLBUTRIN XL) 150 MG 24 hr tablet Take 1 tablet (150 mg total) by mouth daily. 90 tablet 1   Cholecalciferol (VITAMIN D3) 25 MCG (1000 UT) CAPS Take 1 capsule (1,000 Units total) by mouth daily. 90 capsule 3   hydrOXYzine (VISTARIL) 50 MG capsule Take 1-2  capsules (50-100 mg total) by mouth 2 (two) times daily as needed. 180 capsule 1   labetalol (NORMODYNE) 200 MG tablet Take 1 tablet (200 mg total) by mouth daily. 90 tablet 1   pantoprazole (PROTONIX) 40 MG tablet Take 1 tablet (40 mg total) by mouth daily. 90 tablet 1   No current facility-administered medications for this visit.    REVIEW OF SYSTEMS:    10 Point review of Systems was done is negative except as noted above.  PHYSICAL EXAMINATION: ECOG PERFORMANCE STATUS: 1 - Symptomatic but completely ambulatory  . There were no vitals filed for this visit.  There were no vitals filed for this visit.  .There is no height or weight on file to calculate BMI.  NAD*** GENERAL:alert, in no acute distress and comfortable SKIN: no acute rashes, no significant lesions EYES: conjunctiva are pink and non-injected, sclera anicteric NECK: supple, no JVD LYMPH:  no palpable lymphadenopathy in the cervical, axillary or inguinal regions LUNGS: clear to auscultation b/l with normal respiratory effort HEART: regular rate & rhythm ABDOMEN:  normoactive bowel sounds , non tender, not distended. Extremity: no pedal edema PSYCH: alert & oriented x 3 with fluent speech NEURO: no focal motor/sensory deficits  LABORATORY DATA:  I have reviewed the data as listed  .    Latest Ref Rng & Units 02/08/2022   12:09 PM 09/27/2021    2:51 PM 06/14/2021    2:49 PM  CBC  WBC 4.0 - 10.5 K/uL 10.9  12.8  12.7   Hemoglobin 12.0 - 15.0 g/dL 13.2  10.5 Repeated and verified X2.  10.6   Hematocrit 36.0 - 46.0 % 40.6  35.8  35.0   Platelets 150.0 - 400.0 K/uL 305.0  410.0  377.0    .CBC    Component Value Date/Time   WBC 10.9 (H) 02/08/2022 1209   RBC 5.14 (H) 02/08/2022 1209   HGB 13.2 02/08/2022 1209   HCT 40.6 02/08/2022 1209   PLT 305.0 02/08/2022 1209   MCV 79.0 02/08/2022 1209   MCH 21.1 (L) 04/20/2020 1337   MCHC 32.5 02/08/2022 1209   RDW 18.2 (H) 02/08/2022 1209   LYMPHSABS 3.0 09/27/2021  1451   MONOABS 0.7 09/27/2021 1451   EOSABS 0.2 09/27/2021 1451   BASOSABS 0.1 09/27/2021 1451   .    Latest Ref Rng & Units 02/08/2022   12:09 PM 06/14/2021    2:49 PM 04/20/2020    1:37 PM  CMP  Glucose 70 - 99 mg/dL 94  100  99   BUN 6 - 23 mg/dL '10  12  12   '$ Creatinine 0.40 - 1.20 mg/dL 0.94  0.91  0.85   Sodium 135 - 145 mEq/L 138  138  138   Potassium 3.5 - 5.1 mEq/L 3.9  4.0  4.2   Chloride 96 - 112 mEq/L 105  104  104   CO2 19 - 32 mEq/L '26  29  23   '$ Calcium 8.4 -  10.5 mg/dL 9.5  9.4  9.4   Total Protein 6.0 - 8.3 g/dL 7.2  6.9  6.9   Total Bilirubin 0.2 - 1.2 mg/dL 0.4  0.3  0.4   Alkaline Phos 39 - 117 U/L 78  77    AST 0 - 37 U/L '18  16  13   '$ ALT 0 - 35 U/L '17  13  23    '$ . Lab Results  Component Value Date   IRON 50 02/08/2022   IRON 50 02/08/2022   TIBC 417.2 02/08/2022   IRONPCTSAT 12.0 (L) 02/08/2022   (Iron and TIBC)  Lab Results  Component Value Date   FERRITIN 10.7 02/08/2022     RADIOGRAPHIC STUDIES: I have personally reviewed the radiological images as listed and agreed with the findings in the report. No results found.  ASSESSMENT & PLAN:   41 year old female with history of  1) Chronic leukocytosis/neutrophilia This is likely reactive and probably secondary to her PCOS which is considered a chronic inflammatory state and also could potentially be due to her morbid obesity .There is no height or weight on file to calculate BMI.   2) Severe iron deficiency with microcytic anemia This is likely due to her menorrhagia. Patient is fairly symptomatic with significant fatigue and it is significantly affecting her quality of life. . Lab Results  Component Value Date   IRON 50 02/08/2022   IRON 50 02/08/2022   TIBC 417.2 02/08/2022   IRONPCTSAT 12.0 (L) 02/08/2022   (Iron and TIBC)  Lab Results  Component Value Date   FERRITIN 10.7 02/08/2022    #3 thrombocytosis-likely reactive due to iron deficiency. -We will recheck after iron  replacement.  No orders of the defined types were placed in this encounter.  Plan -I discussed potential etiologies of her nonprogressive leukocytosis/neutrophilia. -This is unlikely to be of clonal bone marrow disorder. -We will get a BCR-ABL FISH panel on follow-up labs to ensure this. -She is already working with her primary care physician on management of her prediabetes and to work on weight loss. -Discussed pros and cons of IV iron replacement and patient is agreeable to this. -Proceed with IV Injectafer weekly x2 doses and recheck labs in about 10 weeks after iron.  Follow-up ***  All of the patients questions were answered with apparent satisfaction. The patient knows to call the clinic with any problems, questions or concerns.  I spent *** minutes counseling the patient face to face. The total time spent in the appointment was *** minutes and more than 50% was on counseling and direct patient cares.    Sullivan Lone MD MS AAHIVMS [facility] Hematology/Oncology Physician [facility]  I, Melene Muller, am acting as scribe for Dr. Sullivan Lone, MD.

## 2022-02-19 ENCOUNTER — Other Ambulatory Visit: Payer: Self-pay | Admitting: Nurse Practitioner

## 2022-02-19 DIAGNOSIS — F419 Anxiety disorder, unspecified: Secondary | ICD-10-CM

## 2022-02-19 MED ORDER — HYDROXYZINE PAMOATE 50 MG PO CAPS: 50.0000 mg | ORAL_CAPSULE | Freq: Two times a day (BID) | ORAL | 1 refills | Status: DC | PRN

## 2022-03-08 ENCOUNTER — Encounter: Payer: Self-pay | Admitting: Hematology

## 2022-03-08 NOTE — Progress Notes (Signed)
This encounter was created in error - please disregard.

## 2022-03-15 ENCOUNTER — Encounter: Payer: Self-pay | Admitting: Nurse Practitioner

## 2022-03-22 ENCOUNTER — Encounter: Payer: 59 | Admitting: Nurse Practitioner

## 2022-03-22 NOTE — Progress Notes (Signed)
Attempted to call patient for her visit. She did not answer. Left voicemail asking her to [place] office back.

## 2022-05-24 ENCOUNTER — Other Ambulatory Visit: Payer: Self-pay | Admitting: Nurse Practitioner

## 2022-05-24 ENCOUNTER — Encounter: Payer: Self-pay | Admitting: Nurse Practitioner

## 2022-05-24 DIAGNOSIS — F419 Anxiety disorder, unspecified: Secondary | ICD-10-CM

## 2022-05-28 ENCOUNTER — Other Ambulatory Visit: Payer: Self-pay | Admitting: Nurse Practitioner

## 2022-05-28 DIAGNOSIS — F419 Anxiety disorder, unspecified: Secondary | ICD-10-CM

## 2022-05-28 MED ORDER — HYDROXYZINE PAMOATE 50 MG PO CAPS: 50.0000 mg | ORAL_CAPSULE | Freq: Two times a day (BID) | ORAL | 1 refills | Status: DC | PRN

## 2022-09-06 ENCOUNTER — Other Ambulatory Visit: Payer: Self-pay | Admitting: Nurse Practitioner

## 2022-09-06 DIAGNOSIS — F331 Major depressive disorder, recurrent, moderate: Secondary | ICD-10-CM

## 2022-09-06 DIAGNOSIS — F419 Anxiety disorder, unspecified: Secondary | ICD-10-CM

## 2022-09-11 ENCOUNTER — Encounter: Payer: Self-pay | Admitting: Nurse Practitioner

## 2022-09-13 ENCOUNTER — Other Ambulatory Visit: Payer: Self-pay

## 2022-09-13 DIAGNOSIS — F331 Major depressive disorder, recurrent, moderate: Secondary | ICD-10-CM

## 2022-09-13 DIAGNOSIS — F419 Anxiety disorder, unspecified: Secondary | ICD-10-CM

## 2022-09-13 DIAGNOSIS — E559 Vitamin D deficiency, unspecified: Secondary | ICD-10-CM

## 2022-09-13 DIAGNOSIS — I10 Essential (primary) hypertension: Secondary | ICD-10-CM

## 2022-09-13 DIAGNOSIS — K219 Gastro-esophageal reflux disease without esophagitis: Secondary | ICD-10-CM

## 2022-09-13 MED ORDER — PANTOPRAZOLE SODIUM 40 MG PO TBEC: 40.0000 mg | DELAYED_RELEASE_TABLET | Freq: Every day | ORAL | 0 refills | Status: DC

## 2022-09-13 MED ORDER — BUPROPION HCL ER (XL) 150 MG PO TB24: 150.0000 mg | ORAL_TABLET | Freq: Every day | ORAL | 3 refills | Status: DC

## 2022-09-13 MED ORDER — VITAMIN D3 25 MCG (1000 UT) PO CAPS: 1000.0000 [IU] | ORAL_CAPSULE | Freq: Every day | ORAL | 1 refills | Status: AC

## 2022-09-13 MED ORDER — HYDROXYZINE PAMOATE 50 MG PO CAPS: 50.0000 mg | ORAL_CAPSULE | Freq: Two times a day (BID) | ORAL | 1 refills | Status: DC | PRN

## 2022-09-13 MED ORDER — LABETALOL HCL 200 MG PO TABS: 200.0000 mg | ORAL_TABLET | Freq: Every day | ORAL | 0 refills | Status: DC

## 2022-09-17 ENCOUNTER — Ambulatory Visit: Payer: 59

## 2022-12-17 ENCOUNTER — Other Ambulatory Visit: Payer: Self-pay | Admitting: Nurse Practitioner

## 2022-12-17 DIAGNOSIS — F419 Anxiety disorder, unspecified: Secondary | ICD-10-CM

## 2022-12-27 ENCOUNTER — Other Ambulatory Visit: Payer: Self-pay | Admitting: Nurse Practitioner

## 2022-12-27 DIAGNOSIS — I10 Essential (primary) hypertension: Secondary | ICD-10-CM

## 2022-12-27 DIAGNOSIS — K219 Gastro-esophageal reflux disease without esophagitis: Secondary | ICD-10-CM

## 2023-02-10 ENCOUNTER — Encounter: Payer: Self-pay | Admitting: Nurse Practitioner

## 2023-02-10 DIAGNOSIS — F419 Anxiety disorder, unspecified: Secondary | ICD-10-CM

## 2023-02-11 MED ORDER — HYDROXYZINE PAMOATE 50 MG PO CAPS: ORAL_CAPSULE | ORAL | 0 refills | Status: DC

## 2023-02-11 NOTE — Addendum Note (Signed)
Addended by: Deatra James on: 02/11/2023 11:50 AM   Modules accepted: Orders

## 2023-02-13 ENCOUNTER — Encounter: Payer: 59 | Admitting: Nurse Practitioner

## 2023-03-31 ENCOUNTER — Encounter: Payer: Self-pay | Admitting: Nurse Practitioner

## 2023-03-31 ENCOUNTER — Other Ambulatory Visit: Payer: Self-pay | Admitting: Nurse Practitioner

## 2023-03-31 DIAGNOSIS — K219 Gastro-esophageal reflux disease without esophagitis: Secondary | ICD-10-CM

## 2023-03-31 DIAGNOSIS — I10 Essential (primary) hypertension: Secondary | ICD-10-CM

## 2023-03-31 DIAGNOSIS — F419 Anxiety disorder, unspecified: Secondary | ICD-10-CM

## 2023-03-31 DIAGNOSIS — F331 Major depressive disorder, recurrent, moderate: Secondary | ICD-10-CM

## 2023-04-01 MED ORDER — PANTOPRAZOLE SODIUM 40 MG PO TBEC: 40.0000 mg | DELAYED_RELEASE_TABLET | Freq: Every day | ORAL | 0 refills | Status: DC

## 2023-04-01 MED ORDER — BUPROPION HCL ER (XL) 150 MG PO TB24
150.0000 mg | ORAL_TABLET | Freq: Every day | ORAL | 0 refills | Status: DC
Start: 2023-04-01 — End: 2023-10-16

## 2023-04-01 MED ORDER — LABETALOL HCL 200 MG PO TABS: 200.0000 mg | ORAL_TABLET | Freq: Every day | ORAL | 0 refills | Status: DC

## 2023-04-02 MED ORDER — HYDROXYZINE PAMOATE 50 MG PO CAPS
ORAL_CAPSULE | ORAL | 0 refills | Status: DC
Start: 2023-04-02 — End: 2023-06-04

## 2023-04-02 NOTE — Addendum Note (Signed)
Addended by: Deatra James on: 04/02/2023 09:17 AM   Modules accepted: Orders

## 2023-04-24 ENCOUNTER — Encounter (INDEPENDENT_AMBULATORY_CARE_PROVIDER_SITE_OTHER): Payer: Self-pay

## 2023-05-09 ENCOUNTER — Other Ambulatory Visit: Payer: Self-pay | Admitting: Nurse Practitioner

## 2023-05-09 DIAGNOSIS — F419 Anxiety disorder, unspecified: Secondary | ICD-10-CM

## 2023-05-15 ENCOUNTER — Encounter: Payer: 59 | Admitting: Nurse Practitioner

## 2023-05-15 ENCOUNTER — Encounter: Payer: Self-pay | Admitting: Nurse Practitioner

## 2023-06-02 ENCOUNTER — Other Ambulatory Visit: Payer: Self-pay | Admitting: Nurse Practitioner

## 2023-06-02 DIAGNOSIS — F419 Anxiety disorder, unspecified: Secondary | ICD-10-CM

## 2023-06-03 ENCOUNTER — Encounter: Payer: Self-pay | Admitting: Nurse Practitioner

## 2023-06-04 ENCOUNTER — Other Ambulatory Visit: Payer: Self-pay | Admitting: Nurse Practitioner

## 2023-06-04 DIAGNOSIS — F419 Anxiety disorder, unspecified: Secondary | ICD-10-CM

## 2023-06-04 MED ORDER — HYDROXYZINE PAMOATE 50 MG PO CAPS
ORAL_CAPSULE | ORAL | 1 refills | Status: DC
Start: 2023-06-04 — End: 2023-07-12

## 2023-06-20 ENCOUNTER — Telehealth: Payer: Self-pay | Admitting: Internal Medicine

## 2023-06-20 NOTE — Telephone Encounter (Signed)
Need new patient approval. Patient scheduled through [facility]

## 2023-06-24 NOTE — Telephone Encounter (Signed)
Scheduled

## 2023-06-25 ENCOUNTER — Other Ambulatory Visit: Payer: Self-pay | Admitting: Nurse Practitioner

## 2023-06-25 DIAGNOSIS — I10 Essential (primary) hypertension: Secondary | ICD-10-CM

## 2023-07-10 ENCOUNTER — Other Ambulatory Visit: Payer: Self-pay | Admitting: Nurse Practitioner

## 2023-07-10 DIAGNOSIS — F419 Anxiety disorder, unspecified: Secondary | ICD-10-CM

## 2023-07-17 ENCOUNTER — Encounter: Payer: Self-pay | Admitting: Hematology

## 2023-07-17 ENCOUNTER — Encounter: Payer: 59 | Admitting: Nurse Practitioner

## 2023-08-14 ENCOUNTER — Encounter: Payer: Self-pay | Admitting: Hematology

## 2023-08-21 ENCOUNTER — Ambulatory Visit: Payer: 59 | Admitting: Internal Medicine

## 2023-08-21 ENCOUNTER — Ambulatory Visit: Payer: Self-pay | Admitting: Nurse Practitioner

## 2023-08-21 NOTE — Telephone Encounter (Signed)
Reason for Disposition . [1] Follow-up call to recent contact AND [2] information only call, no triage required  Answer Assessment - Initial Assessment Questions 1. REASON FOR CALL or QUESTION: "What is your reason for calling today?" or "How can I best help you?" or "What question do you have that I can help answer?"     Assistance with appointment. Chart Review indicates patient scheduled for 10/16/2023  Protocols used: Information Only Call - No Triage-A-AH

## 2023-08-21 NOTE — Telephone Encounter (Addendum)
Copied from CRM [id number]. Topic: Appointments - Appointment Scheduling >> Aug 21, 2023  8:07 AM Donita Brooks wrote: Patient/patient representative is calling to schedule an appointment. Refer to attachments for appointment information.   08/21/2023-805 am - Attempted to call to schedule an appointment . Unable to reach/ Left Message requesting a call back to schedule or triage.  08/21/2023-0905am - Attempted to call to schedule an appointment . Unable to reach/ Left Message requesting a call back to schedule or triage.  Chart Review notes Patient cancelled appointment and rescheduled for 10/16/2023

## 2023-10-16 ENCOUNTER — Ambulatory Visit: Payer: Medicaid Other | Admitting: Internal Medicine

## 2023-10-16 ENCOUNTER — Encounter: Payer: Self-pay | Admitting: Internal Medicine

## 2023-10-16 VITALS — BP 148/80 | HR 79 | Ht 62.0 in | Wt 300.0 lb

## 2023-10-16 DIAGNOSIS — Z1159 Encounter for screening for other viral diseases: Secondary | ICD-10-CM | POA: Diagnosis not present

## 2023-10-16 DIAGNOSIS — D509 Iron deficiency anemia, unspecified: Secondary | ICD-10-CM | POA: Diagnosis not present

## 2023-10-16 DIAGNOSIS — E559 Vitamin D deficiency, unspecified: Secondary | ICD-10-CM | POA: Diagnosis not present

## 2023-10-16 DIAGNOSIS — I10 Essential (primary) hypertension: Secondary | ICD-10-CM | POA: Diagnosis not present

## 2023-10-16 DIAGNOSIS — Z1211 Encounter for screening for malignant neoplasm of colon: Secondary | ICD-10-CM | POA: Diagnosis not present

## 2023-10-16 DIAGNOSIS — F419 Anxiety disorder, unspecified: Secondary | ICD-10-CM

## 2023-10-16 DIAGNOSIS — F32A Depression, unspecified: Secondary | ICD-10-CM | POA: Diagnosis not present

## 2023-10-16 DIAGNOSIS — K219 Gastro-esophageal reflux disease without esophagitis: Secondary | ICD-10-CM | POA: Diagnosis not present

## 2023-10-16 MED ORDER — HYDROXYZINE PAMOATE 50 MG PO CAPS: ORAL_CAPSULE | ORAL | 2 refills | Status: DC

## 2023-10-16 MED ORDER — LABETALOL HCL 200 MG PO TABS: 200.0000 mg | ORAL_TABLET | Freq: Once | ORAL | 3 refills | Status: DC

## 2023-10-16 NOTE — Assessment & Plan Note (Signed)
 Gastroenterology referral placed today for screening colonoscopy

## 2023-10-16 NOTE — Assessment & Plan Note (Signed)
Noted on previous labs.  Not currently on vitamin D supplementation.  Repeat vitamin D level ordered today.

## 2023-10-16 NOTE — Progress Notes (Signed)
 New Patient Office Visit  Subjective    Patient ID: Janet Deleon, female    DOB: Apr 20, 1981  Age: 43 y.o. MRN: [id number]  CC:  Chief Complaint  Patient presents with   Establish Care    HPI Janet Deleon presents to establish care.  She is a 43 year old woman with a previously documented past medical history significant for HTN, GERD, anxiety and depression, iron  deficiency anemia, and morbid obesity.  Previously followed by Janet Pereyra, NP. Janet Deleon reports feeling well today.  She is asymptomatic currently.  Her acute concern is poorly controlled hypertension.  Her blood pressure has been elevated recently.  She is taking labetalol  200 mg daily.  She does not have any additional concerns to discuss.  Janet Deleon is currently unemployed.  She denies tobacco, alcohol, and illicit drug use.  Family medical history is significant for CAD and diabetes mellitus.  Acute concerns, chronic medical conditions, and outstanding preventative care items discussed today are individually addressed in A/P below.   Outpatient Encounter Medications as of 10/16/2023  Medication Sig   Cholecalciferol (VITAMIN D3) 25 MCG (1000 UT) CAPS Take 1 capsule (1,000 Units total) by mouth daily.   [DISCONTINUED] hydrOXYzine  (VISTARIL ) 50 MG capsule TAKE 1 TO 2 CAPSULES BY MOUTH TWICE DAILY AS NEEDED   [DISCONTINUED] labetalol  (NORMODYNE ) 200 MG tablet TAKE 1 TABLET BY MOUTH ONCE DAILY . APPOINTMENT REQUIRED FOR FUTURE REFILLS   hydrOXYzine  (VISTARIL ) 50 MG capsule TAKE 1 TO 2 CAPSULES BY MOUTH TWICE DAILY AS NEEDED   labetalol  (NORMODYNE ) 200 MG tablet Take 1 tablet (200 mg total) by mouth once for 1 dose.   [DISCONTINUED] buPROPion  (WELLBUTRIN  XL) 150 MG 24 hr tablet Take 1 tablet (150 mg total) by mouth daily. Must keep 05/15/23 appt for future refills   [DISCONTINUED] pantoprazole  (PROTONIX ) 40 MG tablet Take 1 tablet (40 mg total) by mouth daily. Must keep 05/15/23 appt for future refills   No facility-administered encounter  medications on file as of 10/16/2023.    Past Medical History:  Diagnosis Date   Anemia    Hypertension    PCOS (polycystic ovarian syndrome)     Past Surgical History:  Procedure Laterality Date   CERVICAL CERCLAGE N/A 03/10/2017   Procedure: Emergency CERVICAL CERCLAGE, Excision Vaginal Septum;  Surgeon: Barbette Knock, MD;  Location: [facility];  Service: Gynecology;  Laterality: N/A;  EDD: 07/17/17 Allergy: Sulfa, Demerol   lt eye surgery     OVARIAN CYST REMOVAL     1996   TONSILLECTOMY      Family History  Problem Relation Age of Onset   Hypertension Father    Heart attack Father    Diabetes Father    Liver cancer Paternal Grandmother    Lung cancer Paternal Grandmother     Social History   Socioeconomic History   Marital status: Married    Spouse name: Not on file   Number of children: Not on file   Years of education: Not on file   Highest education level: 11th grade  Occupational History   Not on file  Tobacco Use   Smoking status: Former   Smokeless tobacco: Never  Vaping Use   Vaping status: Never Used  Substance and Sexual Activity   Alcohol use: No    Alcohol/week: 0.0 standard drinks of alcohol   Drug use: No   Sexual activity: Yes  Other Topics Concern   Not on file  Social History Narrative   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   Social Drivers of Corporate Investment Banker Strain: Low Risk  (10/15/2023)   Overall Financial Resource Strain (CARDIA)    Difficulty of Paying Living Expenses: Not very hard  Food Insecurity: No Food Insecurity (10/15/2023)   Hunger Vital Sign    Worried About Running Out of Food in the Last Year: Never true    Ran Out of Food in the Last Year: Never true  Transportation Needs: No Transportation Needs (10/15/2023)   PRAPARE - Administrator, Civil Service (Medical): No    Lack of Transportation (Non-Medical): No  Physical Activity: Unknown (10/15/2023)   Exercise  Vital Sign    Days of Exercise per Week: 0 days    Minutes of Exercise per Session: Not on file  Stress: Stress Concern Present (10/15/2023)   [employer] of Occupational Health - Occupational Stress Questionnaire    Feeling of Stress : Very much  Social Connections: Unknown (10/15/2023)   Social Connection and Isolation Panel [NHANES]    Frequency of Communication with Friends and Family: More than three times a week    Frequency of Social Gatherings with Friends and Family: Patient declined    Attends Religious Services: Patient declined    Database Administrator or Organizations: No    Attends Engineer, Structural: Not on file    Marital Status: Married  Recent Concern: Social Connections - Moderately Isolated (08/20/2023)   Social Connection and Isolation Panel [NHANES]    Frequency of Communication with Friends and Family: More than three times a week    Frequency of Social Gatherings with Friends and Family: Once a week    Attends Religious Services: Never    Database Administrator or Organizations: No    Attends Engineer, Structural: Not on file    Marital Status: Married  Catering Manager Violence: Not on file   Review of Systems  Constitutional:  Negative for chills and fever.  HENT:  Negative for sore throat.   Respiratory:  Negative for cough and shortness of breath.   Cardiovascular:  Negative for chest pain, palpitations and leg swelling.  Gastrointestinal:  Negative for abdominal pain, blood in stool, constipation, diarrhea, nausea and vomiting.  Genitourinary:  Negative for dysuria and hematuria.  Musculoskeletal:  Negative for myalgias.  Skin:  Negative for itching and rash.  Neurological:  Negative for dizziness and headaches.  Psychiatric/Behavioral:  Negative for depression and suicidal ideas.    Objective    BP (!) 148/80   Pulse 79   Ht 5' 2 (1.575 m)   Wt 300 lb (136.1 kg)   SpO2 93%   BMI 54.87 kg/m   Physical Exam Vitals  reviewed.  Constitutional:      General: She is not in acute distress.    Appearance: Normal appearance. She is obese. She is not toxic-appearing.  HENT:     Head: Normocephalic and atraumatic.     Right Ear: External ear normal.     Left Ear: External ear normal.     Nose: Nose normal. No congestion or rhinorrhea.     Mouth/Throat:     Mouth: Mucous membranes are moist.     Pharynx: Oropharynx is clear. No oropharyngeal exudate or posterior oropharyngeal erythema.  Eyes:     General: No scleral icterus.    Extraocular Movements: Extraocular movements intact.     Conjunctiva/sclera: Conjunctivae normal.     Pupils: Pupils are equal, round, and reactive  to light.  Cardiovascular:     Rate and Rhythm: Normal rate and regular rhythm.     Pulses: Normal pulses.     Heart sounds: Normal heart sounds. No murmur heard.    No friction rub. No gallop.  Pulmonary:     Effort: Pulmonary effort is normal.     Breath sounds: Normal breath sounds. No wheezing, rhonchi or rales.  Abdominal:     General: Abdomen is flat. Bowel sounds are normal. There is no distension.     Palpations: Abdomen is soft.     Tenderness: There is no abdominal tenderness.  Musculoskeletal:        General: No swelling. Normal range of motion.     Cervical back: Normal range of motion.     Right lower leg: No edema.     Left lower leg: No edema.  Lymphadenopathy:     Cervical: No cervical adenopathy.  Skin:    General: Skin is warm and dry.     Capillary Refill: Capillary refill takes less than 2 seconds.     Coloration: Skin is not jaundiced.  Neurological:     General: No focal deficit present.     Mental Status: She is alert and oriented to person, place, and time.  Psychiatric:        Mood and Affect: Mood normal.        Behavior: Behavior normal.   Last CBC Lab Results  Component Value Date   WBC 10.9 (H) 02/08/2022   HGB 13.2 02/08/2022   HCT 40.6 02/08/2022   MCV 79.0 02/08/2022   MCH 21.1 (L)  04/20/2020   RDW 18.2 (H) 02/08/2022   PLT 305.0 02/08/2022   Last metabolic panel Lab Results  Component Value Date   GLUCOSE 94 02/08/2022   NA 138 02/08/2022   K 3.9 02/08/2022   CL 105 02/08/2022   CO2 26 02/08/2022   BUN 10 02/08/2022   CREATININE 0.94 02/08/2022   GFR 74.39 02/08/2022   CALCIUM  9.5 02/08/2022   PROT 7.2 02/08/2022   ALBUMIN 4.1 02/08/2022   BILITOT 0.4 02/08/2022   ALKPHOS 78 02/08/2022   AST 18 02/08/2022   ALT 17 02/08/2022   ANIONGAP 10 07/04/2017   Last lipids Lab Results  Component Value Date   CHOL 212 (H) 02/08/2022   HDL 35.60 (L) 02/08/2022   LDLCALC 146 (H) 02/08/2022   TRIG 151.0 (H) 02/08/2022   CHOLHDL 6 02/08/2022   Last hemoglobin A1c Lab Results  Component Value Date   HGBA1C 5.8 02/08/2022   Last thyroid  functions Lab Results  Component Value Date   TSH 0.92 06/14/2021   T4TOTAL 9.2 04/20/2020   Last vitamin D  Lab Results  Component Value Date   VD25OH <7.00 (L) 02/08/2022   Assessment & Plan:   Problem List Items Addressed This Visit       Benign essential hypertension - Primary   Currently prescribed labetalol  200 mg daily.  Her acute concern is poorly controlled HTN.  BP is mildly elevated today at 146/91 initially and 148/80 on repeat. -No medication changes were made today.  Will check basic labs and arrange 4-week follow-up for BP check and lab review.      Gastroesophageal reflux disease   Symptoms are adequately controlled with as needed use of an over-the-counter supplement.      Vitamin D  deficiency disease   Noted on previous labs.  Not currently on vitamin D  supplementation.  Repeat vitamin D  level ordered today.  Anxiety and depression   Chronic issue.  She has previously tried Lexapro  and Wellbutrin  without success.  Currently using hydroxyzine  nightly as needed for anxiety relief, which she feels is effective.  Hydroxyzine  refilled today.      Iron  deficiency anemia   Documented history  of iron  deficiency anemia.  Previously followed by hematology and required IV iron  infusions.  Not currently taking oral iron  supplementation.  Repeat iron  studies ordered today.      Colon cancer screening   Gastroenterology referral placed today for screening colonoscopy      Return in about 4 weeks (around 11/13/2023) for HTN, lab review.   Manus FORBES Fireman, MD

## 2023-10-16 NOTE — Assessment & Plan Note (Signed)
 Documented history of iron  deficiency anemia.  Previously followed by hematology and required IV iron  infusions.  Not currently taking oral iron  supplementation.  Repeat iron  studies ordered today.

## 2023-10-16 NOTE — Patient Instructions (Signed)
 It was a pleasure to see you today.  Thank you for giving us  the opportunity to be involved in your care.  Below is a brief recap of your visit and next steps.  We will plan to see you again in 4 weeks.  Summary You have established care today No medication changes were made. Refills provided Repeat basic labs ordered Gastroenterology referral placed Follow up in 4 weeks for BP check and lab review

## 2023-10-16 NOTE — Assessment & Plan Note (Signed)
 Chronic issue.  She has previously tried Lexapro  and Wellbutrin  without success.  Currently using hydroxyzine  nightly as needed for anxiety relief, which she feels is effective.  Hydroxyzine  refilled today.

## 2023-10-16 NOTE — Assessment & Plan Note (Signed)
 Currently prescribed labetalol  200 mg daily.  Her acute concern is poorly controlled HTN.  BP is mildly elevated today at 146/91 initially and 148/80 on repeat. -No medication changes were made today.  Will check basic labs and arrange 4-week follow-up for BP check and lab review.

## 2023-10-16 NOTE — Assessment & Plan Note (Signed)
 Symptoms are adequately controlled with as needed use of an over-the-counter supplement.

## 2023-10-17 ENCOUNTER — Encounter: Payer: Self-pay | Admitting: Internal Medicine

## 2023-10-17 ENCOUNTER — Other Ambulatory Visit: Payer: Self-pay | Admitting: Internal Medicine

## 2023-10-17 DIAGNOSIS — E559 Vitamin D deficiency, unspecified: Secondary | ICD-10-CM

## 2023-10-17 DIAGNOSIS — D500 Iron deficiency anemia secondary to blood loss (chronic): Secondary | ICD-10-CM

## 2023-10-17 LAB — CBC WITH DIFFERENTIAL/PLATELET
Basophils Absolute: 0.1 10*3/uL (ref 0.0–0.2)
Basos: 1 %
EOS (ABSOLUTE): 0.1 10*3/uL (ref 0.0–0.4)
Eos: 1 %
Hematocrit: 39.8 % (ref 34.0–46.6)
Hemoglobin: 12.6 g/dL (ref 11.1–15.9)
Immature Grans (Abs): 0.1 10*3/uL (ref 0.0–0.1)
Immature Granulocytes: 1 %
Lymphocytes Absolute: 2.1 10*3/uL (ref 0.7–3.1)
Lymphs: 20 %
MCH: 25.7 pg — ABNORMAL LOW (ref 26.6–33.0)
MCHC: 31.7 g/dL (ref 31.5–35.7)
MCV: 81 fL (ref 79–97)
Monocytes Absolute: 0.5 10*3/uL (ref 0.1–0.9)
Monocytes: 5 %
Neutrophils Absolute: 7.6 10*3/uL — ABNORMAL HIGH (ref 1.4–7.0)
Neutrophils: 72 %
Platelets: 340 10*3/uL (ref 150–450)
RBC: 4.91 x10E6/uL (ref 3.77–5.28)
RDW: 15.6 % — ABNORMAL HIGH (ref 11.7–15.4)
WBC: 10.5 10*3/uL (ref 3.4–10.8)

## 2023-10-17 LAB — LIPID PANEL
Chol/HDL Ratio: 6 {ratio} — ABNORMAL HIGH (ref 0.0–4.4)
Cholesterol, Total: 210 mg/dL — ABNORMAL HIGH (ref 100–199)
HDL: 35 mg/dL — ABNORMAL LOW (ref >39)
LDL Chol Calc (NIH): 144 mg/dL — ABNORMAL HIGH (ref 0–99)
Triglycerides: 173 mg/dL — ABNORMAL HIGH (ref 0–149)
VLDL Cholesterol Cal: 31 mg/dL (ref 5–40)

## 2023-10-17 LAB — IRON,TIBC AND FERRITIN PANEL
Ferritin: 22 ng/mL (ref 15–150)
Iron Saturation: 9 % — CL (ref 15–55)
Iron: 36 ug/dL (ref 27–159)
Total Iron Binding Capacity: 383 ug/dL (ref 250–450)
UIBC: 347 ug/dL (ref 131–425)

## 2023-10-17 LAB — CMP14+EGFR
ALT: 17 [IU]/L (ref 0–32)
AST: 18 [IU]/L (ref 0–40)
Albumin: 4.1 g/dL (ref 3.9–4.9)
Alkaline Phosphatase: 86 [IU]/L (ref 44–121)
BUN/Creatinine Ratio: 9 (ref 9–23)
BUN: 9 mg/dL (ref 6–24)
Bilirubin Total: 0.3 mg/dL (ref 0.0–1.2)
CO2: 24 mmol/L (ref 20–29)
Calcium: 9.6 mg/dL (ref 8.7–10.2)
Chloride: 102 mmol/L (ref 96–106)
Creatinine, Ser: 0.97 mg/dL (ref 0.57–1.00)
Globulin, Total: 2.5 g/dL (ref 1.5–4.5)
Glucose: 96 mg/dL (ref 70–99)
Potassium: 4.3 mmol/L (ref 3.5–5.2)
Sodium: 139 mmol/L (ref 134–144)
Total Protein: 6.6 g/dL (ref 6.0–8.5)
eGFR: 73 mL/min/{1.73_m2} (ref >59)

## 2023-10-17 LAB — TSH+FREE T4
Free T4: 1.22 ng/dL (ref 0.82–1.77)
TSH: 0.802 u[IU]/mL (ref 0.450–4.500)

## 2023-10-17 LAB — B12 AND FOLATE PANEL
Folate: 4.2 ng/mL (ref >3.0)
Vitamin B-12: 206 pg/mL — ABNORMAL LOW (ref 232–1245)

## 2023-10-17 LAB — HEMOGLOBIN A1C
Est. average glucose Bld gHb Est-mCnc: 120 mg/dL
Hgb A1c MFr Bld: 5.8 % — ABNORMAL HIGH (ref 4.8–5.6)

## 2023-10-17 LAB — VITAMIN D 25 HYDROXY (VIT D DEFICIENCY, FRACTURES): Vit D, 25-Hydroxy: 6.7 ng/mL — ABNORMAL LOW (ref 30.0–100.0)

## 2023-10-17 LAB — HCV AB W REFLEX TO QUANT PCR: HCV Ab: NONREACTIVE

## 2023-10-17 LAB — HCV INTERPRETATION

## 2023-10-17 MED ORDER — IRON (FERROUS SULFATE) 325 (65 FE) MG PO TABS: 325.0000 mg | ORAL_TABLET | Freq: Every day | ORAL | 3 refills | Status: AC

## 2023-10-17 MED ORDER — VITAMIN D (ERGOCALCIFEROL) 1.25 MG (50000 UNIT) PO CAPS: 50000.0000 [IU] | ORAL_CAPSULE | ORAL | 0 refills | Status: AC

## 2023-10-30 ENCOUNTER — Ambulatory Visit: Payer: 59 | Admitting: Internal Medicine

## 2023-11-11 NOTE — Progress Notes (Deleted)
 Referring Provider: Billie Lade, MD Primary Care Physician:  Billie Lade, MD Primary Gastroenterologist:  Dr. Jena Gauss  No chief complaint on file.   HPI:   Janet Deleon is a 43 y.o. female presenting today at the request of [name], MD for IDA, colon cancer screening.   Reviewed office visit with PCP 10/16/2023.  Noted patient needed screening colonoscopy.  Also noted history of iron deficiency anemia, previously followed by hematology and required IV iron infusions.  She was not currently taking oral iron supplementation and plan to repeat iron studies.  Labs completed 10/16/2023 with hemoglobin 12.6, iron saturation low at 9%, ferritin low normal at 22, iron 36.  Vitamin B12 also low at 206.  She was advised to resume oral iron supplementation and also advised to start oral B12 supplementation.  Per chart review, patient last saw oncology in the office 10/17/2021.  Suspected IDA was secondary to menorrhagia.  Appears last iron infusion was in March 2023.   Today:   Past Medical History:  Diagnosis Date   Anemia    Hypertension    PCOS (polycystic ovarian syndrome)     Past Surgical History:  Procedure Laterality Date   CERVICAL CERCLAGE N/A 03/10/2017   Procedure: Emergency CERVICAL CERCLAGE, Excision Vaginal Septum;  Surgeon: Shea Evans, MD;  Location: [facility];  Service: Gynecology;  Laterality: N/A;  EDD: 07/17/17 Allergy: Sulfa, Demerol   lt eye surgery     OVARIAN CYST REMOVAL     1996   TONSILLECTOMY      Current Outpatient Medications  Medication Sig Dispense Refill   Cholecalciferol (VITAMIN D3) 25 MCG (1000 UT) CAPS Take 1 capsule (1,000 Units total) by mouth daily. 90 capsule 1   hydrOXYzine (VISTARIL) 50 MG capsule TAKE 1 TO 2 CAPSULES BY MOUTH TWICE DAILY AS NEEDED 60 capsule 2   Iron, Ferrous Sulfate, 325 (65 Fe) MG TABS Take 325 mg by mouth daily. 30 tablet 3   labetalol (NORMODYNE) 200 MG tablet Take 1 tablet (200 mg total) by mouth once for 1  dose. 90 tablet 3   Vitamin D, Ergocalciferol, (DRISDOL) 1.25 MG (50000 UNIT) CAPS capsule Take 1 capsule (50,000 Units total) by mouth every 7 (seven) days for 12 doses. 12 capsule 0   No current facility-administered medications for this visit.    Allergies as of 11/13/2023 - Review Complete 10/16/2023  Allergen Reaction Noted   Demerol [meperidine] Nausea And Vomiting and Other (See Comments) 08/23/2014   Sulfa antibiotics Hives 08/23/2014    Family History  Problem Relation Age of Onset   Hypertension Father    Heart attack Father    Diabetes Father    Liver cancer Paternal Grandmother    Lung cancer Paternal Grandmother     Social History   Socioeconomic History   Marital status: Married    Spouse name: Not on file   Number of children: Not on file   Years of education: Not on file   Highest education level: 11th grade  Occupational History   Not on file  Tobacco Use   Smoking status: Former   Smokeless tobacco: Never  Vaping Use   Vaping status: Never Used  Substance and Sexual Activity   Alcohol use: No    Alcohol/week: 0.0 standard drinks of alcohol   Drug use: No   Sexual activity: Yes  Other Topics Concern   Not on file  Social History Narrative   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   Social Drivers of Corporate investment banker Strain: Low Risk  (10/15/2023)   Overall Financial Resource Strain (CARDIA)    Difficulty of Paying Living Expenses: Not very hard  Food Insecurity: No Food Insecurity (10/15/2023)   Hunger Vital Sign    Worried About Running Out of Food in the Last Year: Never true    Ran Out of Food in the Last Year: Never true  Transportation Needs: No Transportation Needs (10/15/2023)   PRAPARE - Administrator, Civil Service (Medical): No    Lack of Transportation (Non-Medical): No  Physical Activity: Unknown (10/15/2023)   Exercise Vital Sign    Days of Exercise per Week: 0 days     Minutes of Exercise per Session: Not on file  Stress: Stress Concern Present (10/15/2023)   [employer] of Occupational Health - Occupational Stress Questionnaire    Feeling of Stress : Very much  Social Connections: Unknown (10/15/2023)   Social Connection and Isolation Panel [NHANES]    Frequency of Communication with Friends and Family: More than three times a week    Frequency of Social Gatherings with Friends and Family: Patient declined    Attends Religious Services: Patient declined    Database administrator or Organizations: No    Attends Engineer, structural: Not on file    Marital Status: Married  Recent Concern: Social Connections - Moderately Isolated (08/20/2023)   Social Connection and Isolation Panel [NHANES]    Frequency of Communication with Friends and Family: More than three times a week    Frequency of Social Gatherings with Friends and Family: Once a week    Attends Religious Services: Never    Database administrator or Organizations: No    Attends Engineer, structural: Not on file    Marital Status: Married  Catering manager Violence: Not on file    Review of Systems: Gen: Denies any fever, chills, fatigue, weight loss, lack of appetite.  CV: Denies chest pain, heart palpitations, peripheral edema, syncope.  Resp: Denies shortness of breath at rest or with exertion. Denies wheezing or cough.  GI: Denies dysphagia or odynophagia. Denies jaundice, hematemesis, fecal incontinence. GU : Denies urinary burning, urinary frequency, urinary hesitancy MS: Denies joint pain, muscle weakness, cramps, or limitation of movement.  Derm: Denies rash, itching, dry skin Psych: Denies depression, anxiety, memory loss, and confusion Heme: Denies bruising, bleeding, and enlarged lymph nodes.  Physical Exam: There were no vitals taken for this visit. General:   Alert and oriented. Pleasant and cooperative. Well-nourished and well-developed.  Head:   Normocephalic and atraumatic. Eyes:  Without icterus, sclera clear and conjunctiva pink.  Ears:  Normal auditory acuity. Lungs:  Clear to auscultation bilaterally. No wheezes, rales, or rhonchi. No distress.  Heart:  S1, S2 present without murmurs appreciated.  Abdomen:  +BS, soft, non-tender and non-distended. No HSM noted. No guarding or rebound. No masses appreciated.  Rectal:  Deferred  Msk:  Symmetrical without gross deformities. Normal posture. Extremities:  Without edema. Neurologic:  Alert and  oriented x4;  grossly normal neurologically. Skin:  Intact without significant lesions or rashes. Psych:  Alert and cooperative. Normal mood and affect.    Assessment:     Plan:  ***   Ermalinda Memos, PA-C [facility] 11/13/2023

## 2023-11-13 ENCOUNTER — Ambulatory Visit: Payer: Medicaid Other | Admitting: Gastroenterology

## 2023-11-25 NOTE — Progress Notes (Deleted)
 Referring Provider: Billie Lade, MD Primary Care Physician:  Billie Lade, MD Primary Gastroenterologist:  Dr. Jena Gauss  No chief complaint on file.   HPI:   Janet Deleon is a 43 y.o. female presenting today at the request of at the request of [name], MD for IDA, colon cancer screening.    Reviewed office visit with PCP 10/16/2023.  Noted patient needed screening colonoscopy.  Also noted history of iron deficiency anemia, previously followed by hematology and required IV iron infusions.  She was not currently taking oral iron supplementation and plan to repeat iron studies.   Labs completed 10/16/2023 with hemoglobin 12.6, iron saturation low at 9%, ferritin low normal at 22, iron 36.  Vitamin B12 also low at 206.  She was advised to resume oral iron supplementation and also advised to start oral B12 supplementation.   Per chart review, patient last saw oncology in the office 10/17/2021.  Suspected IDA was secondary to menorrhagia.  Appears last iron infusion was in March 2023.    Today:    Past Medical History:  Diagnosis Date   Anemia    Hypertension    PCOS (polycystic ovarian syndrome)     Past Surgical History:  Procedure Laterality Date   CERVICAL CERCLAGE N/A 03/10/2017   Procedure: Emergency CERVICAL CERCLAGE, Excision Vaginal Septum;  Surgeon: Shea Evans, MD;  Location: [facility];  Service: Gynecology;  Laterality: N/A;  EDD: 07/17/17 Allergy: Sulfa, Demerol   lt eye surgery     OVARIAN CYST REMOVAL     1996   TONSILLECTOMY      Current Outpatient Medications  Medication Sig Dispense Refill   Cholecalciferol (VITAMIN D3) 25 MCG (1000 UT) CAPS Take 1 capsule (1,000 Units total) by mouth daily. 90 capsule 1   hydrOXYzine (VISTARIL) 50 MG capsule TAKE 1 TO 2 CAPSULES BY MOUTH TWICE DAILY AS NEEDED 60 capsule 2   Iron, Ferrous Sulfate, 325 (65 Fe) MG TABS Take 325 mg by mouth daily. 30 tablet 3   labetalol (NORMODYNE) 200 MG tablet Take 1 tablet (200 mg total)  by mouth once for 1 dose. 90 tablet 3   Vitamin D, Ergocalciferol, (DRISDOL) 1.25 MG (50000 UNIT) CAPS capsule Take 1 capsule (50,000 Units total) by mouth every 7 (seven) days for 12 doses. 12 capsule 0   No current facility-administered medications for this visit.    Allergies as of 11/27/2023 - Review Complete 10/16/2023  Allergen Reaction Noted   Demerol [meperidine] Nausea And Vomiting and Other (See Comments) 08/23/2014   Sulfa antibiotics Hives 08/23/2014    Family History  Problem Relation Age of Onset   Hypertension Father    Heart attack Father    Diabetes Father    Liver cancer Paternal Grandmother    Lung cancer Paternal Grandmother     Social History   Socioeconomic History   Marital status: Married    Spouse name: Not on file   Number of children: Not on file   Years of education: Not on file   Highest education level: 11th grade  Occupational History   Not on file  Tobacco Use   Smoking status: Former   Smokeless tobacco: Never  Vaping Use   Vaping status: Never Used  Substance and Sexual Activity   Alcohol use: No    Alcohol/week: 0.0 standard drinks of alcohol   Drug use: No   Sexual activity: Yes  Other Topics Concern   Not on file  Social History Narrative  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   Social Drivers of Corporate investment banker Strain: Low Risk  (10/15/2023)   Overall Financial Resource Strain (CARDIA)    Difficulty of Paying Living Expenses: Not very hard  Food Insecurity: No Food Insecurity (10/15/2023)   Hunger Vital Sign    Worried About Running Out of Food in the Last Year: Never true    Ran Out of Food in the Last Year: Never true  Transportation Needs: No Transportation Needs (10/15/2023)   PRAPARE - Administrator, Civil Service (Medical): No    Lack of Transportation (Non-Medical): No  Physical Activity: Unknown (10/15/2023)   Exercise Vital Sign    Days of Exercise  per Week: 0 days    Minutes of Exercise per Session: Not on file  Stress: Stress Concern Present (10/15/2023)   [employer] of Occupational Health - Occupational Stress Questionnaire    Feeling of Stress : Very much  Social Connections: Unknown (10/15/2023)   Social Connection and Isolation Panel [NHANES]    Frequency of Communication with Friends and Family: More than three times a week    Frequency of Social Gatherings with Friends and Family: Patient declined    Attends Religious Services: Patient declined    Database administrator or Organizations: No    Attends Engineer, structural: Not on file    Marital Status: Married  Recent Concern: Social Connections - Moderately Isolated (08/20/2023)   Social Connection and Isolation Panel [NHANES]    Frequency of Communication with Friends and Family: More than three times a week    Frequency of Social Gatherings with Friends and Family: Once a week    Attends Religious Services: Never    Database administrator or Organizations: No    Attends Engineer, structural: Not on file    Marital Status: Married  Catering manager Violence: Not on file    Review of Systems: Gen: Denies any fever, chills, fatigue, weight loss, lack of appetite.  CV: Denies chest pain, heart palpitations, peripheral edema, syncope.  Resp: Denies shortness of breath at rest or with exertion. Denies wheezing or cough.  GI: Denies dysphagia or odynophagia. Denies jaundice, hematemesis, fecal incontinence. GU : Denies urinary burning, urinary frequency, urinary hesitancy MS: Denies joint pain, muscle weakness, cramps, or limitation of movement.  Derm: Denies rash, itching, dry skin Psych: Denies depression, anxiety, memory loss, and confusion Heme: Denies bruising, bleeding, and enlarged lymph nodes.  Physical Exam: There were no vitals taken for this visit. General:   Alert and oriented. Pleasant and cooperative. Well-nourished and  well-developed.  Head:  Normocephalic and atraumatic. Eyes:  Without icterus, sclera clear and conjunctiva pink.  Ears:  Normal auditory acuity. Lungs:  Clear to auscultation bilaterally. No wheezes, rales, or rhonchi. No distress.  Heart:  S1, S2 present without murmurs appreciated.  Abdomen:  +BS, soft, non-tender and non-distended. No HSM noted. No guarding or rebound. No masses appreciated.  Rectal:  Deferred  Msk:  Symmetrical without gross deformities. Normal posture. Extremities:  Without edema. Neurologic:  Alert and  oriented x4;  grossly normal neurologically. Skin:  Intact without significant lesions or rashes. Psych:  Alert and cooperative. Normal mood and affect.    Assessment:     Plan:  ***   Ermalinda Memos, PA-C [facility] 11/27/2023

## 2023-11-27 ENCOUNTER — Ambulatory Visit: Admitting: Gastroenterology

## 2023-11-28 ENCOUNTER — Encounter: Payer: Self-pay | Admitting: Gastroenterology

## 2023-12-04 ENCOUNTER — Ambulatory Visit: Payer: Medicaid Other | Admitting: Internal Medicine

## 2023-12-18 ENCOUNTER — Ambulatory Visit: Admitting: Internal Medicine

## 2024-01-08 ENCOUNTER — Ambulatory Visit: Admitting: Internal Medicine

## 2024-01-10 ENCOUNTER — Other Ambulatory Visit: Payer: Self-pay | Admitting: Internal Medicine

## 2024-01-10 DIAGNOSIS — F419 Anxiety disorder, unspecified: Secondary | ICD-10-CM

## 2024-02-05 ENCOUNTER — Ambulatory Visit: Admitting: Internal Medicine

## 2024-02-09 ENCOUNTER — Other Ambulatory Visit: Payer: Self-pay | Admitting: Internal Medicine

## 2024-02-09 DIAGNOSIS — F419 Anxiety disorder, unspecified: Secondary | ICD-10-CM

## 2024-02-17 ENCOUNTER — Ambulatory Visit: Admitting: Internal Medicine

## 2024-02-19 ENCOUNTER — Ambulatory Visit: Admitting: Internal Medicine

## 2024-03-02 ENCOUNTER — Other Ambulatory Visit: Payer: Self-pay | Admitting: Internal Medicine

## 2024-03-02 DIAGNOSIS — F419 Anxiety disorder, unspecified: Secondary | ICD-10-CM

## 2024-03-27 ENCOUNTER — Other Ambulatory Visit: Payer: Self-pay

## 2024-03-27 DIAGNOSIS — F419 Anxiety disorder, unspecified: Secondary | ICD-10-CM

## 2024-04-22 ENCOUNTER — Other Ambulatory Visit: Payer: Self-pay

## 2024-04-22 DIAGNOSIS — F419 Anxiety disorder, unspecified: Secondary | ICD-10-CM

## 2024-05-14 ENCOUNTER — Other Ambulatory Visit: Payer: Self-pay

## 2024-05-14 DIAGNOSIS — F419 Anxiety disorder, unspecified: Secondary | ICD-10-CM

## 2024-05-18 ENCOUNTER — Other Ambulatory Visit: Payer: Self-pay

## 2024-05-18 DIAGNOSIS — F419 Anxiety disorder, unspecified: Secondary | ICD-10-CM

## 2024-05-24 ENCOUNTER — Other Ambulatory Visit: Payer: Self-pay

## 2024-05-24 DIAGNOSIS — F419 Anxiety disorder, unspecified: Secondary | ICD-10-CM

## 2024-05-24 MED ORDER — HYDROXYZINE PAMOATE 50 MG PO CAPS: ORAL_CAPSULE | ORAL | 0 refills | Status: DC

## 2024-05-24 NOTE — Telephone Encounter (Signed)
 Copied from CRM #[id number]. Topic: Clinical - Medication Refill >> May 24, 2024 12:46 PM Yolanda T wrote: Medication: hydrOXYzine  (VISTARIL ) 50 MG capsule   Has the patient contacted their pharmacy? No This is the patient's preferred pharmacy:  [facility] [address] [place] - [address] [place] [address] Phone: [phone] Fax: [phone]  Is this the correct pharmacy for this prescription? Yes  Has the prescription been filled recently? Yes  Is the patient out of the medication? Yes  Has the patient been seen for an appointment in the last year OR does the patient have an upcoming appointment? Yes  Can we respond through MyChart? Yes  Agent: Please be advised that Rx refills may take up to 3 business days. We ask that you follow-up with your pharmacy.

## 2024-06-03 ENCOUNTER — Encounter: Payer: Self-pay | Admitting: Family Medicine

## 2024-06-14 ENCOUNTER — Other Ambulatory Visit: Payer: Self-pay | Admitting: Family Medicine

## 2024-06-14 DIAGNOSIS — F419 Anxiety disorder, unspecified: Secondary | ICD-10-CM

## 2024-06-15 ENCOUNTER — Other Ambulatory Visit: Payer: Self-pay | Admitting: Family Medicine

## 2024-06-15 DIAGNOSIS — F419 Anxiety disorder, unspecified: Secondary | ICD-10-CM

## 2024-06-17 ENCOUNTER — Encounter: Admitting: Nurse Practitioner

## 2024-06-29 ENCOUNTER — Other Ambulatory Visit: Payer: Self-pay | Admitting: Family Medicine

## 2024-06-29 DIAGNOSIS — F419 Anxiety disorder, unspecified: Secondary | ICD-10-CM

## 2024-07-20 ENCOUNTER — Other Ambulatory Visit: Payer: Self-pay | Admitting: Family Medicine

## 2024-07-20 DIAGNOSIS — F419 Anxiety disorder, unspecified: Secondary | ICD-10-CM

## 2024-07-23 ENCOUNTER — Encounter: Payer: Self-pay | Admitting: Family Medicine

## 2024-07-23 ENCOUNTER — Ambulatory Visit: Admitting: Family Medicine

## 2024-07-23 DIAGNOSIS — E559 Vitamin D deficiency, unspecified: Secondary | ICD-10-CM

## 2024-07-23 DIAGNOSIS — Z1211 Encounter for screening for malignant neoplasm of colon: Secondary | ICD-10-CM

## 2024-07-23 DIAGNOSIS — R7301 Impaired fasting glucose: Secondary | ICD-10-CM

## 2024-07-23 DIAGNOSIS — I10 Essential (primary) hypertension: Secondary | ICD-10-CM | POA: Diagnosis not present

## 2024-07-23 DIAGNOSIS — E782 Mixed hyperlipidemia: Secondary | ICD-10-CM

## 2024-07-23 DIAGNOSIS — Z6841 Body Mass Index (BMI) 40.0 and over, adult: Secondary | ICD-10-CM | POA: Diagnosis not present

## 2024-07-23 DIAGNOSIS — E038 Other specified hypothyroidism: Secondary | ICD-10-CM | POA: Diagnosis not present

## 2024-07-23 DIAGNOSIS — Z01419 Encounter for gynecological examination (general) (routine) without abnormal findings: Secondary | ICD-10-CM

## 2024-07-23 MED ORDER — LABETALOL HCL 200 MG PO TABS
200.0000 mg | ORAL_TABLET | Freq: Two times a day (BID) | ORAL | 3 refills | Status: AC
Start: 2024-07-23 — End: ?

## 2024-07-23 MED ORDER — PHENTERMINE HCL 15 MG PO CAPS
15.0000 mg | ORAL_CAPSULE | ORAL | 0 refills | Status: AC
Start: 2024-07-23 — End: ?

## 2024-07-23 NOTE — Patient Instructions (Addendum)
 I appreciate the opportunity to provide care to you today!    Follow up:  4 months/ 1 month follow up with nurses for weight check and BP check  Labs: please stop by the lab today to get your blood drawn (CBC, CMP, TSH, Lipid profile, HgA1c, Vit D)  Start taking phentermine 15 mg daily as prescribed. Please monitor for potential side effects, including elevated blood pressure, palpitations or rapid heart rate, insomnia, dry mouth, anxiety or restlessness, headaches, and constipation. Contact the clinic immediately if you experience severe symptoms such as chest pain, shortness of breath, severe dizziness, or an irregular heartbeat. I recommend implementing lifestyle changes, including following a heart-healthy diet and increasing physical activity, for optimal results in managing weight and overall health. Please monitor for side effects such as elevated blood pressure, palpitations, insomnia, dry mouth, and arrhythmias while on this treatment regimen. Contact me if you experience any of these symptoms. We will follow up monthly to assess the effectiveness of the treatment and your response to therapy.  Emphasize Lifestyle Changes: A heart-healthy diet and increased physical activity are crucial. Healthy Tips for Weight Loss: Increase Intake of Nutrient-Rich Foods: Prioritize fruits, vegetables, and whole grains. Incorporate Lean Proteins: Include chicken, fish, beans, and legumes in your diet. Choose Low-Fat Dairy Products: Opt for dairy products that are low in fat. Reduce Unhealthy Fats: Limit saturated fats, trans fatty acids, and cholesterol. Aim for Regular Physical Activity: Engage in at least 30 minutes of brisk walking or other physical activities on at least 5 days a week.      Hypertension Management  Your current blood pressure is above the target goal of <140/90 mmHg. To address this, please start taking labetalol  200 mg BID  Medication Instructions: Take your blood pressure  medication at the same time each day. After taking your medication, check your blood pressure at least an hour later. If your first reading is >140/90 mmHg, wait at least 10 minutes and recheck your blood pressure. Side Effects: In the initial days of therapy, you may experience dizziness or lightheadedness as your body adjusts to the lower blood pressure; this is expected. Diet and Lifestyle: Adhere to a low-sodium diet, limiting intake to less than 1500 mg daily, and increase your physical activity. Avoid over-the-counter NSAIDs such as ibuprofen  and naproxen while on this medication. Hydration and Nutrition: Stay well-hydrated by drinking at least 64 ounces of water daily. Increase your servings of fruits and vegetables and avoid excessive sodium in your diet. Long-Term Considerations: Uncontrolled hypertension can increase the risk of cardiovascular diseases, including stroke, coronary artery disease, and heart failure.  Please report to the emergency department if your blood pressure exceeds 180/120 and is accompanied by symptoms such as headaches, chest pain, palpitations, blurred vision, or dizziness.   Please follow up if your symptoms worsen or fail to improve.   Please stop by your local pharmacy and get your Tdap and Shingles vaccine  Referrals today-  GI and OBGYN   Please continue to a heart-healthy diet and increase your physical activities. Try to exercise for at least five days a week.    It was a pleasure to see you and I look forward to continuing to work together on your health and well-being. Please do not hesitate to call the office if you need care or have questions about your care.  In case of emergency, please visit the Emergency Department for urgent care, or contact our clinic at [phone] to schedule an appointment. We're  here to help you!   Have a wonderful day and week. With Gratitude, Meade JENEANE Gerlach MSN, FNP-BC, PMHNP-BC

## 2024-07-23 NOTE — Progress Notes (Signed)
 Established Patient Office Visit  Subjective:  Patient ID: Janet Deleon, female    DOB: 02-16-1981  Age: 43 y.o. MRN: [id number]  CC:  Chief Complaint  Patient presents with   Obesity    Wants to discuss options for weight loss    Referral    Needs a new referral to another OBGYN that accepts medicaid.     HPI Janet Deleon is a 43 y.o. female with past medical history of HTN, Obesity, Vit d deficiency presents for f/u of  chronic medical conditions.  For the details of today's visit, please refer to the assessment and plan.     Past Medical History:  Diagnosis Date   Anemia    Hypertension    PCOS (polycystic ovarian syndrome)     Past Surgical History:  Procedure Laterality Date   CERVICAL CERCLAGE N/A 03/10/2017   Procedure: Emergency CERVICAL CERCLAGE, Excision Vaginal Septum;  Surgeon: Barbette Knock, MD;  Location: [facility];  Service: Gynecology;  Laterality: N/A;  EDD: 07/17/17 Allergy: Sulfa, Demerol   lt eye surgery     OVARIAN CYST REMOVAL     1996   TONSILLECTOMY      Family History  Problem Relation Age of Onset   Hypertension Father    Heart attack Father    Diabetes Father    Liver cancer Paternal Grandmother    Lung cancer Paternal Grandmother     Social History   Socioeconomic History   Marital status: Married    Spouse name: Not on file   Number of children: Not on file   Years of education: Not on file   Highest education level: 11th grade  Occupational History   Not on file  Tobacco Use   Smoking status: Former   Smokeless tobacco: Never  Vaping Use   Vaping status: Never Used  Substance and Sexual Activity   Alcohol use: No    Alcohol/week: 0.0 standard drinks of alcohol   Drug use: No   Sexual activity: Yes  Other Topics Concern   Not on file  Social History Narrative   Married for 5 years.Homemaker,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   Social Drivers of Health   Financial Resource Strain: Patient Declined  (07/22/2024)   Overall Financial Resource Strain (CARDIA)    Difficulty of Paying Living Expenses: Patient declined  Food Insecurity: Patient Declined (07/22/2024)   Hunger Vital Sign    Worried About Running Out of Food in the Last Year: Patient declined    Ran Out of Food in the Last Year: Patient declined  Transportation Needs: No Transportation Needs (07/22/2024)   PRAPARE - Administrator, Civil Service (Medical): No    Lack of Transportation (Non-Medical): No  Physical Activity: Inactive (07/22/2024)   Exercise Vital Sign    Days of Exercise per Week: 0 days    Minutes of Exercise per Session: Not on file  Stress: No Stress Concern Present (07/22/2024)   [employer] of Occupational Health - Occupational Stress Questionnaire    Feeling of Stress: Only a little  Social Connections: Moderately Isolated (07/22/2024)   Social Connection and Isolation Panel    Frequency of Communication with Friends and Family: More than three times a week    Frequency of Social Gatherings with Friends and Family: Once a week    Attends Religious Services: Never    Database Administrator or Organizations: No    Attends Banker Meetings: Not on file  Marital Status: Married  Catering Manager Violence: Not on file    Outpatient Medications Prior to Visit  Medication Sig Dispense Refill   Cholecalciferol (VITAMIN D3) 25 MCG (1000 UT) CAPS Take 1 capsule (1,000 Units total) by mouth daily. 90 capsule 1   hydrOXYzine  (VISTARIL ) 50 MG capsule TAKE 1 TO 2 CAPSULES BY MOUTH TWICE DAILY AS NEEDED 60 capsule 0   Iron , Ferrous Sulfate , 325 (65 Fe) MG TABS Take 325 mg by mouth daily. 30 tablet 3   labetalol  (NORMODYNE ) 200 MG tablet Take 1 tablet (200 mg total) by mouth once for 1 dose. 90 tablet 3   No facility-administered medications prior to visit.    Allergies  Allergen Reactions   Demerol [Meperidine] Nausea And Vomiting and Other (See Comments)    Reaction:  High  fever   Sulfa Antibiotics Hives    ROS Review of Systems  Constitutional:  Negative for chills and fever.  Eyes:  Negative for visual disturbance.  Respiratory:  Negative for chest tightness and shortness of breath.   Genitourinary:  Negative for frequency.  Neurological:  Negative for dizziness and headaches.      Objective:    Physical Exam Constitutional:      Appearance: She is obese.  HENT:     Head: Normocephalic.     Mouth/Throat:     Mouth: Mucous membranes are moist.  Cardiovascular:     Rate and Rhythm: Normal rate.     Heart sounds: Normal heart sounds.  Pulmonary:     Effort: Pulmonary effort is normal.     Breath sounds: Normal breath sounds.  Neurological:     Mental Status: She is alert.     BP (!) 156/94   Pulse (!) 55   Resp 16   Ht 5' 2 (1.575 m)   Wt 294 lb (133.4 kg)   SpO2 100%   BMI 53.77 kg/m  Wt Readings from Last 3 Encounters:  07/23/24 294 lb (133.4 kg)  10/16/23 300 lb (136.1 kg)  02/08/22 296 lb (134.3 kg)    Lab Results  Component Value Date   TSH 1.180 07/23/2024   Lab Results  Component Value Date   WBC 12.5 (H) 07/23/2024   HGB 13.2 07/23/2024   HCT 42.3 07/23/2024   MCV 82 07/23/2024   PLT 305 07/23/2024   Lab Results  Component Value Date   NA 139 07/23/2024   K 4.3 07/23/2024   CO2 23 07/23/2024   GLUCOSE 101 (H) 07/23/2024   BUN 14 07/23/2024   CREATININE 1.16 (H) 07/23/2024   BILITOT 0.4 07/23/2024   ALKPHOS 70 07/23/2024   AST 17 07/23/2024   ALT 16 07/23/2024   PROT 6.9 07/23/2024   ALBUMIN 4.3 07/23/2024   CALCIUM  10.4 (H) 07/23/2024   ANIONGAP 10 07/04/2017   EGFR 59 (L) 07/23/2024   GFR 74.39 02/08/2022   Lab Results  Component Value Date   CHOL 210 (H) 07/23/2024   Lab Results  Component Value Date   HDL 31 (L) 07/23/2024   Lab Results  Component Value Date   LDLCALC 136 (H) 07/23/2024   Lab Results  Component Value Date   TRIG 235 (H) 07/23/2024   Lab Results  Component Value  Date   CHOLHDL 6.8 (H) 07/23/2024   Lab Results  Component Value Date   HGBA1C 5.7 (H) 07/23/2024      Assessment & Plan:  Morbid obesity (HCC) Assessment & Plan: The patient reports implementing lifestyle modifications with minimal  change in her weight. She expresses interest in starting weight loss medication to assist with weight reduction. Various options were discussed, and considering her insurance coverage, therapy with phentermine 15 mg weekly was initiated.  She was encouraged to monitor for potential side effects, including elevated blood pressure, palpitations or rapid heart rate, insomnia, dry mouth, anxiety or restlessness, headaches, and constipation.   Orders: -     Phentermine HCl; Take 1 capsule (15 mg total) by mouth every morning.  Dispense: 30 capsule; Refill: 0  Women's annual routine gynecological examination Assessment & Plan: No complaints or concerns voiced. The patient would like to establish care with gynecology.   Orders: -     Ambulatory referral to Obstetrics / Gynecology  Colon cancer screening -     Ambulatory referral to Gastroenterology  Benign essential hypertension -     Labetalol  HCl; Take 1 tablet (200 mg total) by mouth 2 (two) times daily.  Dispense: 90 tablet; Refill: 3  IFG (impaired fasting glucose) -     Hemoglobin A1c  Vitamin D  deficiency -     VITAMIN D  25 Hydroxy (Vit-D Deficiency, Fractures)  TSH (thyroid -stimulating hormone deficiency) -     TSH + free T4  Mixed hyperlipidemia -     Lipid panel -     CMP14+EGFR -     CBC with Differential/Platelet  Note: This chart has been completed using Engineer, Civil (consulting) software, and while attempts have been made to ensure accuracy, certain words and phrases may not be transcribed as intended.    Follow-up: Return in about 4 months (around 11/20/2024).   Niels Cranshaw  Z Bacchus, FNP

## 2024-07-24 ENCOUNTER — Ambulatory Visit: Payer: Self-pay | Admitting: Family Medicine

## 2024-07-24 DIAGNOSIS — E559 Vitamin D deficiency, unspecified: Secondary | ICD-10-CM

## 2024-07-24 LAB — CMP14+EGFR
ALT: 16 IU/L (ref 0–32)
AST: 17 IU/L (ref 0–40)
Albumin: 4.3 g/dL (ref 3.9–4.9)
Alkaline Phosphatase: 70 IU/L (ref 41–116)
BUN/Creatinine Ratio: 12 (ref 9–23)
BUN: 14 mg/dL (ref 6–24)
Bilirubin Total: 0.4 mg/dL (ref 0.0–1.2)
CO2: 23 mmol/L (ref 20–29)
Calcium: 10.4 mg/dL — ABNORMAL HIGH (ref 8.7–10.2)
Chloride: 99 mmol/L (ref 96–106)
Creatinine, Ser: 1.16 mg/dL — ABNORMAL HIGH (ref 0.57–1.00)
Globulin, Total: 2.6 g/dL (ref 1.5–4.5)
Glucose: 101 mg/dL — ABNORMAL HIGH (ref 70–99)
Potassium: 4.3 mmol/L (ref 3.5–5.2)
Sodium: 139 mmol/L (ref 134–144)
Total Protein: 6.9 g/dL (ref 6.0–8.5)
eGFR: 59 mL/min/1.73 — ABNORMAL LOW (ref >59)

## 2024-07-24 LAB — CBC WITH DIFFERENTIAL/PLATELET
Basophils Absolute: 0.1 x10E3/uL (ref 0.0–0.2)
Basos: 1 %
EOS (ABSOLUTE): 0.2 x10E3/uL (ref 0.0–0.4)
Eos: 2 %
Hematocrit: 42.3 % (ref 34.0–46.6)
Hemoglobin: 13.2 g/dL (ref 11.1–15.9)
Immature Grans (Abs): 0.1 x10E3/uL (ref 0.0–0.1)
Immature Granulocytes: 1 %
Lymphocytes Absolute: 2.5 x10E3/uL (ref 0.7–3.1)
Lymphs: 20 %
MCH: 25.5 pg — ABNORMAL LOW (ref 26.6–33.0)
MCHC: 31.2 g/dL — ABNORMAL LOW (ref 31.5–35.7)
MCV: 82 fL (ref 79–97)
Monocytes Absolute: 0.7 x10E3/uL (ref 0.1–0.9)
Monocytes: 5 %
Neutrophils Absolute: 8.9 x10E3/uL — ABNORMAL HIGH (ref 1.4–7.0)
Neutrophils: 71 %
Platelets: 305 x10E3/uL (ref 150–450)
RBC: 5.17 x10E6/uL (ref 3.77–5.28)
RDW: 15.7 % — ABNORMAL HIGH (ref 11.7–15.4)
WBC: 12.5 x10E3/uL — ABNORMAL HIGH (ref 3.4–10.8)

## 2024-07-24 LAB — LIPID PANEL
Chol/HDL Ratio: 6.8 ratio — ABNORMAL HIGH (ref 0.0–4.4)
Cholesterol, Total: 210 mg/dL — ABNORMAL HIGH (ref 100–199)
HDL: 31 mg/dL — ABNORMAL LOW (ref >39)
LDL Chol Calc (NIH): 136 mg/dL — ABNORMAL HIGH (ref 0–99)
Triglycerides: 235 mg/dL — ABNORMAL HIGH (ref 0–149)
VLDL Cholesterol Cal: 43 mg/dL — ABNORMAL HIGH (ref 5–40)

## 2024-07-24 LAB — TSH+FREE T4
Free T4: 1.31 ng/dL (ref 0.82–1.77)
TSH: 1.18 u[IU]/mL (ref 0.450–4.500)

## 2024-07-24 LAB — HEMOGLOBIN A1C
Est. average glucose Bld gHb Est-mCnc: 117 mg/dL
Hgb A1c MFr Bld: 5.7 % — ABNORMAL HIGH (ref 4.8–5.6)

## 2024-07-24 LAB — VITAMIN D 25 HYDROXY (VIT D DEFICIENCY, FRACTURES): Vit D, 25-Hydroxy: 15.5 ng/mL — ABNORMAL LOW (ref 30.0–100.0)

## 2024-07-24 MED ORDER — VITAMIN D (ERGOCALCIFEROL) 1.25 MG (50000 UNIT) PO CAPS: 50000.0000 [IU] | ORAL_CAPSULE | ORAL | 1 refills | Status: AC

## 2024-07-26 DIAGNOSIS — Z01419 Encounter for gynecological examination (general) (routine) without abnormal findings: Secondary | ICD-10-CM | POA: Insufficient documentation

## 2024-07-26 NOTE — Assessment & Plan Note (Signed)
 No complaints or concerns voiced. The patient would like to establish care with gynecology.

## 2024-07-26 NOTE — Assessment & Plan Note (Signed)
 The patient reports implementing lifestyle modifications with minimal change in her weight. She expresses interest in starting weight loss medication to assist with weight reduction. Various options were discussed, and considering her insurance coverage, therapy with phentermine 15 mg weekly was initiated.  She was encouraged to monitor for potential side effects, including elevated blood pressure, palpitations or rapid heart rate, insomnia, dry mouth, anxiety or restlessness, headaches, and constipation.

## 2024-07-27 ENCOUNTER — Encounter (INDEPENDENT_AMBULATORY_CARE_PROVIDER_SITE_OTHER): Payer: Self-pay | Admitting: *Deleted

## 2024-07-28 ENCOUNTER — Encounter: Payer: Self-pay | Admitting: Family Medicine

## 2024-08-11 ENCOUNTER — Other Ambulatory Visit: Payer: Self-pay | Admitting: Family Medicine

## 2024-08-11 DIAGNOSIS — F419 Anxiety disorder, unspecified: Secondary | ICD-10-CM

## 2024-08-27 ENCOUNTER — Ambulatory Visit

## 2024-09-01 ENCOUNTER — Other Ambulatory Visit: Payer: Self-pay | Admitting: Family Medicine

## 2024-09-01 DIAGNOSIS — F419 Anxiety disorder, unspecified: Secondary | ICD-10-CM

## 2024-09-24 ENCOUNTER — Other Ambulatory Visit: Payer: Self-pay | Admitting: Family Medicine

## 2024-09-24 DIAGNOSIS — F419 Anxiety disorder, unspecified: Secondary | ICD-10-CM

## 2024-10-13 ENCOUNTER — Other Ambulatory Visit: Payer: Self-pay | Admitting: Family Medicine

## 2024-10-13 DIAGNOSIS — F419 Anxiety disorder, unspecified: Secondary | ICD-10-CM

## 2024-11-25 ENCOUNTER — Ambulatory Visit: Admitting: Family Medicine
